# Patient Record
Sex: Female | Born: 1975 | Race: Black or African American | Hispanic: No | Marital: Married | Smoking: Former smoker
Health system: Southern US, Community
[De-identification: ages and names within clinical notes are randomized; demographics above are authoritative.]

## PROBLEM LIST (undated history)

## (undated) DIAGNOSIS — G510 Bell's palsy: Secondary | ICD-10-CM

## (undated) DIAGNOSIS — F419 Anxiety disorder, unspecified: Secondary | ICD-10-CM

## (undated) DIAGNOSIS — I10 Essential (primary) hypertension: Secondary | ICD-10-CM

## (undated) DIAGNOSIS — I639 Cerebral infarction, unspecified: Secondary | ICD-10-CM

## (undated) HISTORY — DX: Cerebral infarction, unspecified: I63.9

## (undated) HISTORY — PX: OTHER SURGICAL HISTORY: SHX169

## (undated) HISTORY — PX: TUBAL LIGATION: SHX77

## (undated) HISTORY — DX: Anxiety disorder, unspecified: F41.9

## (undated) HISTORY — PX: UPPER GI ENDOSCOPY: SHX6162

## (undated) HISTORY — PX: CHOLECYSTECTOMY: SHX55

## (undated) HISTORY — PX: ANKLE SURGERY: SHX546

## (undated) HISTORY — DX: Essential (primary) hypertension: I10

---

## 2013-09-17 ENCOUNTER — Emergency Department (HOSPITAL_COMMUNITY)
Admission: EM | Admit: 2013-09-17 | Discharge: 2013-09-17 | Disposition: A | Discharge status: Home / Self Care | Payer: BC Managed Care – PPO | Attending: Emergency Medicine | Admitting: Emergency Medicine

## 2013-09-17 ENCOUNTER — Encounter (HOSPITAL_COMMUNITY): Payer: Self-pay | Admitting: Emergency Medicine

## 2013-09-17 ENCOUNTER — Emergency Department (HOSPITAL_COMMUNITY): Payer: BC Managed Care – PPO

## 2013-09-17 DIAGNOSIS — R143 Flatulence: Secondary | ICD-10-CM

## 2013-09-17 DIAGNOSIS — Z3202 Encounter for pregnancy test, result negative: Secondary | ICD-10-CM | POA: Insufficient documentation

## 2013-09-17 DIAGNOSIS — Z9851 Tubal ligation status: Secondary | ICD-10-CM | POA: Insufficient documentation

## 2013-09-17 DIAGNOSIS — R142 Eructation: Secondary | ICD-10-CM | POA: Insufficient documentation

## 2013-09-17 DIAGNOSIS — R1032 Left lower quadrant pain: Secondary | ICD-10-CM

## 2013-09-17 DIAGNOSIS — R141 Gas pain: Secondary | ICD-10-CM

## 2013-09-17 DIAGNOSIS — R112 Nausea with vomiting, unspecified: Secondary | ICD-10-CM | POA: Insufficient documentation

## 2013-09-17 LAB — URINALYSIS, ROUTINE W REFLEX MICROSCOPIC
BILIRUBIN URINE: NEGATIVE
Glucose, UA: NEGATIVE mg/dL
Ketones, ur: NEGATIVE mg/dL
Leukocytes, UA: NEGATIVE
Nitrite: NEGATIVE
PH: 8 (ref 5.0–8.0)
PROTEIN: NEGATIVE mg/dL
Specific Gravity, Urine: 1.014 (ref 1.005–1.030)
Urobilinogen, UA: 1 mg/dL (ref 0.0–1.0)

## 2013-09-17 LAB — CBC WITH DIFFERENTIAL/PLATELET
BASOS PCT: 0 % (ref 0–1)
Basophils Absolute: 0 10*3/uL (ref 0.0–0.1)
Eosinophils Absolute: 0.1 10*3/uL (ref 0.0–0.7)
Eosinophils Relative: 1 % (ref 0–5)
HEMATOCRIT: 36.5 % (ref 36.0–46.0)
HEMOGLOBIN: 11.9 g/dL — AB (ref 12.0–15.0)
LYMPHS ABS: 2.6 10*3/uL (ref 0.7–4.0)
LYMPHS PCT: 31 % (ref 12–46)
MCH: 27.2 pg (ref 26.0–34.0)
MCHC: 32.6 g/dL (ref 30.0–36.0)
MCV: 83.3 fL (ref 78.0–100.0)
MONO ABS: 0.7 10*3/uL (ref 0.1–1.0)
MONOS PCT: 8 % (ref 3–12)
NEUTROS ABS: 4.9 10*3/uL (ref 1.7–7.7)
NEUTROS PCT: 59 % (ref 43–77)
Platelets: 284 10*3/uL (ref 150–400)
RBC: 4.38 MIL/uL (ref 3.87–5.11)
RDW: 14.4 % (ref 11.5–15.5)
WBC: 8.3 10*3/uL (ref 4.0–10.5)

## 2013-09-17 LAB — WET PREP, GENITAL
Trich, Wet Prep: NONE SEEN
Yeast Wet Prep HPF POC: NONE SEEN

## 2013-09-17 LAB — POCT PREGNANCY, URINE: Preg Test, Ur: NEGATIVE

## 2013-09-17 LAB — COMPREHENSIVE METABOLIC PANEL
ALBUMIN: 3.4 g/dL — AB (ref 3.5–5.2)
ALK PHOS: 91 U/L (ref 39–117)
ALT: 13 U/L (ref 0–35)
AST: 13 U/L (ref 0–37)
BUN: 7 mg/dL (ref 6–23)
CHLORIDE: 101 meq/L (ref 96–112)
CO2: 26 mEq/L (ref 19–32)
CREATININE: 0.68 mg/dL (ref 0.50–1.10)
Calcium: 9.3 mg/dL (ref 8.4–10.5)
GFR calc Af Amer: >90 mL/min (ref >90)
GFR calc non Af Amer: >90 mL/min (ref >90)
GLUCOSE: 105 mg/dL — AB (ref 70–99)
POTASSIUM: 3.8 meq/L (ref 3.7–5.3)
Sodium: 140 mEq/L (ref 137–147)
Total Bilirubin: <0.2 mg/dL — ABNORMAL LOW (ref 0.3–1.2)
Total Protein: 8.2 g/dL (ref 6.0–8.3)

## 2013-09-17 LAB — URINE MICROSCOPIC-ADD ON

## 2013-09-17 LAB — LIPASE, BLOOD: Lipase: 39 U/L (ref 11–59)

## 2013-09-17 MED ORDER — MINERAL OIL RE ENEM: 1.0000 | ENEMA | Freq: Once | RECTAL | Status: DC

## 2013-09-17 MED ORDER — KETOROLAC TROMETHAMINE 60 MG/2ML IM SOLN
60.0000 mg | Freq: Once | INTRAMUSCULAR | Status: AC
  Administered 2013-09-17: 60 mg via INTRAMUSCULAR
  Filled 2013-09-17: qty 2

## 2013-09-17 NOTE — ED Notes (Signed)
Pt was seen for same abd pain 2 weeks ago. Pt has tried laxatives and stool softeners with no relief.

## 2013-09-17 NOTE — ED Provider Notes (Signed)
CSN: [id number]     Arrival date & time 09/17/13  1620 History   First MD Initiated Contact with Patient 09/17/13 1847     Chief Complaint  Patient presents with  . Abdominal Pain  . Nausea  . Emesis   (Consider location/radiation/quality/duration/timing/severity/associated sxs/prior Treatment) HPI Pt is a 38yo female presenting with LLQ pain that has been intermittent x 3 weeks, sharp in nature, 10/10 at wosrt, 7/10 at this time. Reports being evaluated by [facility] 1-2 weeks ago, had lab work and CT. Advised nothing was found but pt was discharged home with laxatives, stool softeners, gas medication, pain medication, and something for heartburn. Pt states symptoms have not improved. Reports last BM earlier this morning, reports being normal.  LMP ended today, normal per pt. Denies hx of ovarian cysts or fibroids. Denies urinary or vaginal symptoms. Denies fevers. Denies recent sick contacts or recent travel. Denies hx of abdominal surgeries.  History reviewed. No pertinent past medical history. Past Surgical History  Procedure Laterality Date  . Tubal ligation     History reviewed. No pertinent family history. History  Substance Use Topics  . Smoking status: Never Smoker   . Smokeless tobacco: Not on file  . Alcohol Use: Not on file   OB History   Grav Para Term Preterm Abortions TAB SAB Ect Mult Living                 Review of Systems  Constitutional: Negative for fever and chills.  Gastrointestinal: Positive for nausea and abdominal pain ( LLQ). Negative for vomiting, diarrhea, constipation and blood in stool.  Genitourinary: Negative for dysuria, urgency, hematuria, flank pain, decreased urine volume, vaginal bleeding, vaginal discharge, vaginal pain and pelvic pain.  All other systems reviewed and are negative.    Allergies  Erythromycin  Home Medications   Current Outpatient Rx  Name  Route  Sig  Dispense  Refill  . mineral oil enema   Rectal   Place 1  enema rectally once.   3 enema   0   . Multiple Vitamin (MULTIVITAMIN WITH MINERALS) TABS tablet   Oral   Take 1 tablet by mouth daily.          BP 122/78  Pulse 78  Temp(Src) 98.5 F (36.9 C) (Oral)  Resp 18  SpO2 98%  LMP 09/15/2013 Physical Exam  Nursing note and vitals reviewed. Constitutional: She appears well-developed and well-nourished. No distress.  Pt sitting on side of bed, appears mildly uncomfortable.   HENT:  Head: Normocephalic and atraumatic.  Eyes: Conjunctivae are normal. No scleral icterus.  Neck: Normal range of motion.  Cardiovascular: Normal rate, regular rhythm and normal heart sounds.   Pulmonary/Chest: Effort normal and breath sounds normal. No respiratory distress. She has no wheezes. She has no rales. She exhibits no tenderness.  Abdominal: Soft. Bowel sounds are normal. She exhibits no distension and no mass. There is tenderness. There is no rebound and no guarding.  Soft, non-distended.  LLQ pain  Musculoskeletal: Normal range of motion.  Neurological: She is alert.  Skin: Skin is warm and dry. She is not diaphoretic.    ED Course  Procedures (including critical care time) Labs Review Labs Reviewed  WET PREP, GENITAL - Abnormal; Notable for the following:    Clue Cells Wet Prep HPF POC FEW (*)    WBC, Wet Prep HPF POC FEW (*)    All other components within normal limits  CBC WITH DIFFERENTIAL - Abnormal; Notable  for the following:    Hemoglobin 11.9 (*)    All other components within normal limits  COMPREHENSIVE METABOLIC PANEL - Abnormal; Notable for the following:    Glucose, Bld 105 (*)    Albumin 3.4 (*)    Total Bilirubin <0.2 (*)    All other components within normal limits  URINALYSIS, ROUTINE W REFLEX MICROSCOPIC - Abnormal; Notable for the following:    Hgb urine dipstick TRACE (*)    All other components within normal limits  GC/CHLAMYDIA PROBE AMP  LIPASE, BLOOD  URINE MICROSCOPIC-ADD ON  POCT PREGNANCY, URINE    Imaging Review Dg Abd Acute W/chest  09/17/2013   CLINICAL DATA:  Left abdominal pain.  Nausea and vomiting.  EXAM: ACUTE ABDOMEN SERIES (ABDOMEN 2 VIEW & CHEST 1 VIEW)  COMPARISON:  None.  FINDINGS: There is no evidence of dilated bowel loops or free intraperitoneal air. No radiopaque calculi or other significant radiographic abnormality is seen. Clips seen in the pelvis from previous bilateral tubal ligation.  Heart size and mediastinal contours are within normal limits. Both lungs are clear.  IMPRESSION: Negative abdominal radiographs.  No acute cardiopulmonary disease.   Electronically Signed   By: Earle Gell M.D.   On: 09/17/2013 20:17    EKG Interpretation   None       MDM   1. LLQ pain   2. Abdominal gas pain    Pt is a 38yo female with no significant PMH presenting with LLQ pain. Reports being evaluated at [facility] 1-2 weeks ago. Medical records obtained, pt tx for constipation, discharged home with stool softeners and pain medication.  Labs today: CBC, CMP, UA, Lipase, UA: unremarkable. Urine preg: negative.   Pelvic performed: unremarkable.   Wet prep: unremarkable  Will discharge home. Advised to continue taking medications as prescribed from [place] should only be taken when pain becomes severe as narcotics can worsen constipation. Rx: mineral oil enema.      Noland Fordyce, PA-C 09/18/13 [phone]

## 2013-09-17 NOTE — Discharge Instructions (Signed)
Continue taking medication as prescribed by [facility]. Be sure to follow up with a primary care provider and schedule a follow up appointment with a [facility] as needed for continued lower abdominal pain. Return to ER for NEW or worsening symptoms including inability to pass gas, keep down fluids, experience rectal bleeding or develop urinary symptoms.

## 2013-09-17 NOTE — Progress Notes (Signed)
   CARE MANAGEMENT ED NOTE 09/17/2013  Patient:  DebraDebra Potter   Account Number:  [id number]  Date Initiated:  09/17/2013  Documentation initiated by:  Livia Snellen  Subjective/Objective Assessment:   Patient presents to Ed with abdominal pain for two weeks.     Subjective/Objective Assessment Detail:     Action/Plan:   Action/Plan Detail:   Anticipated DC Date:       Status Recommendation to Physician:   Result of Recommendation:    Other ED [place]  Other  PCP issues    Choice offered to / List presented to:            Status of service:  Completed, signed off  ED Comments:   ED Comments Detail:  EDCM spoke to patient at bedside.  Patient confirms that she does not have a pcp.  EDCM instructed patient to call the number on the back of her insurace card or go to insurance compant website to help her find a pcp whois close to her and within network.  Patient verbalized understanding.  No further CM needs at this time.

## 2013-09-17 NOTE — ED Notes (Signed)
Pt c/o abd pain x 2 wks.  States that she was here before for this same issue and was given laxatives, gas medicine, pain meds, and something for heart burn.  States that nothing was found but pt wants to know why she is still hurting.

## 2013-09-18 LAB — GC/CHLAMYDIA PROBE AMP
CT Probe RNA: NEGATIVE
GC Probe RNA: NEGATIVE

## 2013-09-18 NOTE — ED Provider Notes (Signed)
Medical screening examination/treatment/procedure(s) were performed by non-physician practitioner and as supervising physician I was immediately available for consultation/collaboration.  EKG Interpretation   None         Osvaldo Shipper, MD 09/18/13 1616

## 2016-05-13 LAB — HM MAMMOGRAPHY

## 2017-06-30 ENCOUNTER — Inpatient Hospital Stay (HOSPITAL_COMMUNITY): Payer: Managed Care, Other (non HMO)

## 2017-06-30 ENCOUNTER — Emergency Department (HOSPITAL_BASED_OUTPATIENT_CLINIC_OR_DEPARTMENT_OTHER): Payer: Managed Care, Other (non HMO)

## 2017-06-30 ENCOUNTER — Inpatient Hospital Stay (HOSPITAL_BASED_OUTPATIENT_CLINIC_OR_DEPARTMENT_OTHER)
Admission: EM | Admit: 2017-06-30 | Discharge: 2017-07-02 | DRG: 880 | Disposition: A | Discharge status: Home / Self Care | Payer: Managed Care, Other (non HMO) | Attending: Neurology | Admitting: Neurology

## 2017-06-30 ENCOUNTER — Encounter (HOSPITAL_BASED_OUTPATIENT_CLINIC_OR_DEPARTMENT_OTHER): Payer: Self-pay | Admitting: *Deleted

## 2017-06-30 ENCOUNTER — Emergency Department (HOSPITAL_COMMUNITY): Payer: Managed Care, Other (non HMO)

## 2017-06-30 DIAGNOSIS — E876 Hypokalemia: Secondary | ICD-10-CM | POA: Diagnosis present

## 2017-06-30 DIAGNOSIS — R29702 NIHSS score 2: Secondary | ICD-10-CM | POA: Diagnosis present

## 2017-06-30 DIAGNOSIS — E669 Obesity, unspecified: Secondary | ICD-10-CM | POA: Diagnosis present

## 2017-06-30 DIAGNOSIS — G459 Transient cerebral ischemic attack, unspecified: Secondary | ICD-10-CM | POA: Diagnosis present

## 2017-06-30 DIAGNOSIS — R0789 Other chest pain: Secondary | ICD-10-CM

## 2017-06-30 DIAGNOSIS — I059 Rheumatic mitral valve disease, unspecified: Secondary | ICD-10-CM

## 2017-06-30 DIAGNOSIS — Z6837 Body mass index (BMI) 37.0-37.9, adult: Secondary | ICD-10-CM

## 2017-06-30 DIAGNOSIS — R29810 Facial weakness: Secondary | ICD-10-CM | POA: Diagnosis present

## 2017-06-30 DIAGNOSIS — R531 Weakness: Secondary | ICD-10-CM

## 2017-06-30 DIAGNOSIS — I639 Cerebral infarction, unspecified: Secondary | ICD-10-CM | POA: Diagnosis present

## 2017-06-30 DIAGNOSIS — F410 Panic disorder [episodic paroxysmal anxiety] without agoraphobia: Principal | ICD-10-CM | POA: Diagnosis present

## 2017-06-30 DIAGNOSIS — G8194 Hemiplegia, unspecified affecting left nondominant side: Secondary | ICD-10-CM | POA: Diagnosis present

## 2017-06-30 DIAGNOSIS — F419 Anxiety disorder, unspecified: Secondary | ICD-10-CM

## 2017-06-30 DIAGNOSIS — Z881 Allergy status to other antibiotic agents status: Secondary | ICD-10-CM

## 2017-06-30 DIAGNOSIS — I10 Essential (primary) hypertension: Secondary | ICD-10-CM

## 2017-06-30 DIAGNOSIS — E785 Hyperlipidemia, unspecified: Secondary | ICD-10-CM

## 2017-06-30 DIAGNOSIS — Z9282 Status post administration of tPA (rtPA) in a different facility within the last 24 hours prior to admission to current facility: Secondary | ICD-10-CM

## 2017-06-30 HISTORY — DX: Bell's palsy: G51.0

## 2017-06-30 HISTORY — DX: Cerebral infarction, unspecified: I63.9

## 2017-06-30 LAB — COMPREHENSIVE METABOLIC PANEL
ALK PHOS: 63 U/L (ref 38–126)
ALT: 20 U/L (ref 14–54)
ANION GAP: 5 (ref 5–15)
AST: 21 U/L (ref 15–41)
Albumin: 3.6 g/dL (ref 3.5–5.0)
BUN: 9 mg/dL (ref 6–20)
CALCIUM: 8.8 mg/dL — AB (ref 8.9–10.3)
CO2: 24 mmol/L (ref 22–32)
Chloride: 107 mmol/L (ref 101–111)
Creatinine, Ser: 0.83 mg/dL (ref 0.44–1.00)
GFR calc non Af Amer: >60 mL/min (ref >60)
Glucose, Bld: 91 mg/dL (ref 65–99)
Potassium: 3.4 mmol/L — ABNORMAL LOW (ref 3.5–5.1)
SODIUM: 136 mmol/L (ref 135–145)
Total Bilirubin: 0.4 mg/dL (ref 0.3–1.2)
Total Protein: 7.7 g/dL (ref 6.5–8.1)

## 2017-06-30 LAB — DIFFERENTIAL
BASOS PCT: 1 %
Basophils Absolute: 0 10*3/uL (ref 0.0–0.1)
EOS PCT: 3 %
Eosinophils Absolute: 0.2 10*3/uL (ref 0.0–0.7)
LYMPHS PCT: 41 %
Lymphs Abs: 2.9 10*3/uL (ref 0.7–4.0)
MONO ABS: 0.6 10*3/uL (ref 0.1–1.0)
Monocytes Relative: 9 %
Neutro Abs: 3.3 10*3/uL (ref 1.7–7.7)
Neutrophils Relative %: 46 %

## 2017-06-30 LAB — RAPID URINE DRUG SCREEN, HOSP PERFORMED
Amphetamines: NOT DETECTED
BARBITURATES: NOT DETECTED
BENZODIAZEPINES: NOT DETECTED
Cocaine: NOT DETECTED
Opiates: NOT DETECTED
Tetrahydrocannabinol: NOT DETECTED

## 2017-06-30 LAB — URINALYSIS, ROUTINE W REFLEX MICROSCOPIC
BILIRUBIN URINE: NEGATIVE
Glucose, UA: NEGATIVE mg/dL
HGB URINE DIPSTICK: NEGATIVE
KETONES UR: NEGATIVE mg/dL
LEUKOCYTES UA: NEGATIVE
NITRITE: NEGATIVE
Protein, ur: 30 mg/dL — AB
SPECIFIC GRAVITY, URINE: 1.013 (ref 1.005–1.030)
pH: 6 (ref 5.0–8.0)

## 2017-06-30 LAB — CBC
HCT: 40.7 % (ref 36.0–46.0)
Hemoglobin: 13.7 g/dL (ref 12.0–15.0)
MCH: 29 pg (ref 26.0–34.0)
MCHC: 33.7 g/dL (ref 30.0–36.0)
MCV: 86.2 fL (ref 78.0–100.0)
PLATELETS: 288 10*3/uL (ref 150–400)
RBC: 4.72 MIL/uL (ref 3.87–5.11)
RDW: 14.2 % (ref 11.5–15.5)
WBC: 7 10*3/uL (ref 4.0–10.5)

## 2017-06-30 LAB — ETHANOL: Alcohol, Ethyl (B): <10 mg/dL (ref <10)

## 2017-06-30 LAB — PROTIME-INR
INR: 1.04
PROTHROMBIN TIME: 13.5 s (ref 11.4–15.2)

## 2017-06-30 LAB — PREGNANCY, URINE: Preg Test, Ur: NEGATIVE

## 2017-06-30 LAB — TROPONIN I

## 2017-06-30 LAB — APTT: aPTT: 26 seconds (ref 24–36)

## 2017-06-30 MED ORDER — ACETAMINOPHEN 160 MG/5ML PO SOLN: 650.0000 mg | ORAL | Status: DC | PRN

## 2017-06-30 MED ORDER — STROKE: EARLY STAGES OF RECOVERY BOOK
Freq: Once | Status: DC
  Filled 2017-06-30: qty 1

## 2017-06-30 MED ORDER — TRAMADOL HCL 50 MG PO TABS
50.0000 mg | ORAL_TABLET | Freq: Once | ORAL | Status: AC
  Administered 2017-06-30: 50 mg via ORAL
  Filled 2017-06-30: qty 1

## 2017-06-30 MED ORDER — PHENYTOIN SODIUM 50 MG/ML IJ SOLN: 100.0000 mg | Freq: Three times a day (TID) | INTRAMUSCULAR | Status: DC

## 2017-06-30 MED ORDER — IOPAMIDOL (ISOVUE-370) INJECTION 76%
INTRAVENOUS | Status: AC
  Administered 2017-06-30: 50 mL
  Filled 2017-06-30: qty 50

## 2017-06-30 MED ORDER — ACETAMINOPHEN 325 MG PO TABS
650.0000 mg | ORAL_TABLET | ORAL | Status: DC | PRN
  Administered 2017-06-30 – 2017-07-01 (×2): 650 mg via ORAL
  Filled 2017-06-30 (×2): qty 2

## 2017-06-30 MED ORDER — SODIUM CHLORIDE 0.9 % IV SOLN
INTRAVENOUS | Status: DC
  Administered 2017-06-30 – 2017-07-01 (×2): via INTRAVENOUS

## 2017-06-30 MED ORDER — PANTOPRAZOLE SODIUM 40 MG IV SOLR
40.0000 mg | Freq: Every day | INTRAVENOUS | Status: DC
  Administered 2017-06-30: 40 mg via INTRAVENOUS
  Filled 2017-06-30: qty 40

## 2017-06-30 MED ORDER — SODIUM CHLORIDE 0.9 % IV SOLN
50.0000 mL | Freq: Once | INTRAVENOUS | Status: AC
  Administered 2017-06-30: 50 mL via INTRAVENOUS

## 2017-06-30 MED ORDER — ACETAMINOPHEN 650 MG RE SUPP: 650.0000 mg | RECTAL | Status: DC | PRN

## 2017-06-30 MED ORDER — CLEVIDIPINE BUTYRATE 0.5 MG/ML IV EMUL: 0.0000 mg/h | INTRAVENOUS | Status: DC | PRN

## 2017-06-30 MED ORDER — LABETALOL HCL 5 MG/ML IV SOLN
10.0000 mg | Freq: Once | INTRAVENOUS | Status: AC | PRN
  Administered 2017-07-01: 10 mg via INTRAVENOUS
  Filled 2017-06-30: qty 4

## 2017-06-30 MED ORDER — ONDANSETRON HCL 4 MG/2ML IJ SOLN
4.0000 mg | Freq: Four times a day (QID) | INTRAMUSCULAR | Status: DC | PRN
  Administered 2017-06-30: 4 mg via INTRAVENOUS
  Filled 2017-06-30: qty 2

## 2017-06-30 MED ORDER — LABETALOL HCL 5 MG/ML IV SOLN
20.0000 mg | Freq: Once | INTRAVENOUS | Status: DC
  Filled 2017-06-30: qty 4

## 2017-06-30 MED ORDER — POTASSIUM CHLORIDE CRYS ER 20 MEQ PO TBCR
20.0000 meq | EXTENDED_RELEASE_TABLET | Freq: Once | ORAL | Status: AC
  Administered 2017-06-30: 20 meq via ORAL
  Filled 2017-06-30: qty 1

## 2017-06-30 MED ORDER — ALTEPLASE (STROKE) FULL DOSE INFUSION
0.9000 mg/kg | Freq: Once | INTRAVENOUS | Status: AC
  Administered 2017-06-30: 76 mg via INTRAVENOUS
  Filled 2017-06-30: qty 100

## 2017-06-30 MED ORDER — ALTEPLASE 100 MG IV SOLR
INTRAVENOUS | Status: AC
  Filled 2017-06-30: qty 1

## 2017-06-30 NOTE — H&P (Signed)
Neurology H&P  CC: Left sided numbness  History is obtained from:patient  HPI: [name] is a 41 y.o. female with a history of OCP use who presents with left-sided weakness.  She also complains of left-sided chest wall pain.  This is very reproducible to palpation just adjacent to the left breast.   LKW: 3:15 PM tpa given?:  Yes, given at [facility]  ROS: A 14 point ROS was performed and is negative except as noted in the HPI.   PMHx: "Hormones need regulating"   FHx: No history of MI or stroke No history of blood clots  Social History:  reports that she has never smoked. She has never used smokeless tobacco. She reports that she drinks alcohol. She reports that she does not use drugs.   Exam: Current vital signs: BP (!) 175/121   Pulse 75   Temp 98.4 F (36.9 C) (Oral)   Resp 20   Ht 4\' 11"  (1.499 m)   Wt 83.9 kg (184 lb 15.5 oz)   SpO2 100%   BMI 37.36 kg/m  Vital signs in last 24 hours: Temp:  [98.4 F (36.9 C)] 98.4 F (36.9 C) (11/02 1812) Pulse Rate:  [75-93] 75 (11/02 1835) Resp:  [15-20] 20 (11/02 1835) BP: (165-176)/(107-121) 175/121 (11/02 1835) SpO2:  [100 %] 100 % (11/02 1835) Weight:  [83.9 kg (184 lb 15.5 oz)] 83.9 kg (184 lb 15.5 oz) (11/02 1807)  Physical Exam  Constitutional: Appears well-developed and well-nourished.  Psych: Affect appropriate to situation Eyes: No scleral injection HENT: No OP obstrucion Head: Normocephalic.  Cardiovascular: Normal rate and regular rhythm.  Respiratory: Effort normal and breath sounds normal to anterior ascultation GI: Soft.  No distension. There is no tenderness.  Skin: WDI  Neuro: Mental Status: Patient is awake, alert, oriented to person, place, month, year, and situation. Patient is able to give a clear and coherent history. No signs of aphasia or neglect Cranial Nerves: II: Visual Fields are full. Pupils are equal, round, and reactive to light.   III,IV, VI: EOMI without ptosis or diploplia.  V:  Facial sensation is symmetric to temperature VII: Facil movement is mildly weak in the left face including forehead VIII: hearing is intact to voice X: Uvula elevates symmetrically XI: Shoulder shrug is symmetric. XII: tongue is midline without atrophy or fasciculations.  Motor: Tone is normal. Bulk is normal.  She has 4/5 strength of the left arm and leg, with pronator drift and drift in the leg. Sensory: Sensation is diminished throughout the left side, she did appear to split the midline to pinprick Cerebellar: FNF and HKS are intact bilaterally   I have reviewed labs in epic and the results pertinent to this consultation are: CMP-mild hypokalemia  I have reviewed the images obtained: CT head-no acute findings  Impression: This 41 year old female with acute onset left-sided weakness and numbness most consistent with small ischemic infarct.  She has been treated with IV TPA, and is being admitted for further evaluation.  Recommendations: 1. HgbA1c, fasting lipid panel 2. MRI, MRA  of the brain without contrast 3. Frequent neuro checks 4. Echocardiogram 5. Carotid dopplers 6. Prophylactic therapy-Antiplatelet med: Aspirin - dose 325mg  PO or 300mg  PR 7. Risk factor modification 8. Telemetry monitoring 9. PT consult, OT consult, Speech consult 10.  Oral potassium for hypokalemia 11.  Discontinue OCP 12. please page stroke NP  Or  PA  Or MD  from 8am -4 pm as this patient will be followed by the stroke team  at this point.   You can look them up on [email or web address]    Roland Rack, MD [facility] [phone]  If 7pm- 7am, please page neurology on call as listed in [place]. 06/30/2017  7:19 PM

## 2017-06-30 NOTE — ED Notes (Signed)
EDP at bedside  

## 2017-06-30 NOTE — ED Notes (Signed)
Code Stroke called per EDP.

## 2017-06-30 NOTE — ED Triage Notes (Signed)
Yesterday she started having a stabbing pain in both sides of her chest. Pain continued through the night and today. 3 hours ago she starting having sharp pain in her left arm with numbness in her left arm. She is able to move her arm and hand but it feels numb.

## 2017-06-30 NOTE — ED Notes (Signed)
Per pt she started having L arm numbness that started at 15:15 today. Pt has a hx of Bell's Palsy with normal facial drooping on the L side. Per visitor at bedside he notes that pt's smile is drooping more than usual on the L side.

## 2017-06-30 NOTE — ED Provider Notes (Signed)
[facility] 4NORTH NEURO/TRAUMA/SURGICAL ICU Provider Note  CSN: [id number] Arrival date & time: 06/30/17 1801  Chief Complaint(s) Chest Pain and Numbness  HPI [name] is a 41 y.o. female with a history of right-sided Bell's palsy presents to the emergency department with 2 days of aching left-sided chest wall pain exacerbated with movement and palpation.  No associated shortness of breath.  No recent fevers or infections.  Additionally patient reports sudden onset of left arm numbness and weakness with associated left facial droop that began approximately 3 hours prior to arrival.  Patient denies any associated headache, visual disturbance, trouble speaking.  She is endorsing mild left lower extremity weakness.  No alleviating or aggravating factors.  She denies any other physical complaints.  HPI  Past Medical History History reviewed. No pertinent past medical history. Patient Active Problem List   Diagnosis Date Noted  . 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 Authorizing Provider  norethindrone (AYGESTIN) 5 MG tablet Take by mouth daily.   Yes [provider]  mineral oil enema Place 1 enema rectally once. Patient not taking: Reported on 06/30/2017 09/17/13   Tyrell Antonio                                                                                                                                    Past Surgical History Past Surgical History:  Procedure Laterality Date  . CHOLECYSTECTOMY    . TUBAL LIGATION     Family History No family history on file.  Social History Social History  Substance Use Topics  . Smoking status: Never Smoker  . Smokeless tobacco: Never Used  . Alcohol use Yes   Allergies Erythromycin  Review of Systems Review of Systems All other systems are reviewed and are negative for acute change except as noted in the HPI  Physical Exam Vital  Signs  I have reviewed the triage vital signs BP (!) 165/90   Pulse 79   Temp 98.6 F (37 C) (Oral)   Resp (!) 22   Ht 4\' 11"  (1.499 m)   Wt 83.4 kg (183 lb 13.8 oz)   SpO2 99%   BMI 37.14 kg/m   Physical Exam  Constitutional: She is oriented to person, place, and time. She appears well-developed and well-nourished. No distress.  HENT:  Head: Normocephalic and atraumatic.  Nose: Nose normal.  Eyes: Pupils are equal, round, and reactive to light. Conjunctivae and EOM are normal. Right eye exhibits no discharge. Left eye exhibits no discharge. No scleral icterus.  Neck: Normal range of motion. Neck supple.  Cardiovascular: Normal rate and regular rhythm.  Exam reveals no gallop and no friction rub.   No murmur heard. Pulmonary/Chest: Effort normal and breath sounds normal. No stridor. No respiratory distress. She has no rales. She exhibits tenderness.    Abdominal: Soft. She exhibits  no distension. There is no tenderness.  Musculoskeletal: She exhibits no edema or tenderness.  Neurological: She is alert and oriented to person, place, and time.  Mental Status:  Alert and oriented to person, place, and time.  Attention and concentration normal.  Speech clear.  Recent memory is intact  Cranial Nerves:  II Visual Fields: Intact to confrontation. Visual fields intact. III, IV, VI: Pupils equal and reactive to light and near. Full eye movement without nystagmus  V Facial Sensation: Normal. No weakness of masticatory muscles  VII: mild right facial droop (baseline); left facial droop, forehead sparring. VIII Auditory Acuity: Grossly normal  IX/X: The uvula is midline; the palate elevates symmetrically  XI: Normal sternocleidomastoid and trapezius strength  XII: The tongue is midline. No atrophy or fasciculations.   Motor System: Muscle Strength: 4-/5 to LUE and LLE with drift in both extremities. Right extremities with 5/5 strength  Muscle Tone: Tone and muscle bulk are normal in  the upper and lower extremities.   Reflexes: DTRs: 1+ and symmetrical in all four extremities. No Clonus Coordination: Intact finger-to-nose, heel-to-shin. No tremor.  Sensation: Decreased sensation to left upper and lower extremity Gait: Deferred  Skin: Skin is warm and dry. No rash noted. She is not diaphoretic. No erythema.  Psychiatric: She has a normal mood and affect.  Vitals reviewed.   ED Results and Treatments Labs (all labs ordered are listed, but only abnormal results are displayed) Labs Reviewed  COMPREHENSIVE METABOLIC PANEL - Abnormal; Notable for the following:       Result Value   Potassium 3.4 (*)    Calcium 8.8 (*)    All other components within normal limits  URINALYSIS, ROUTINE W REFLEX MICROSCOPIC - Abnormal; Notable for the following:    Color, Urine STRAW (*)    Protein, ur 30 (*)    Bacteria, UA RARE (*)    Squamous Epithelial / LPF 0-5 (*)    All other components within normal limits  MRSA PCR SCREENING  ETHANOL  CBC  DIFFERENTIAL  TROPONIN I  PROTIME-INR  APTT  PREGNANCY, URINE  RAPID URINE DRUG SCREEN, HOSP PERFORMED  RAPID URINE DRUG SCREEN, HOSP PERFORMED  URINALYSIS, ROUTINE W REFLEX MICROSCOPIC  PREGNANCY, URINE  HEMOGLOBIN A1C  LIPID PANEL  CBC  BASIC METABOLIC PANEL  HIV ANTIBODY (ROUTINE TESTING)                                                                                                                         EKG  EKG Interpretation  Date/Time:  Friday June 30 2017 18:12:03 EDT Ventricular Rate:  68 PR Interval:  122 QRS Duration: 92 QT Interval:  376 QTC Calculation: 399 R Axis:   -3 Text Interpretation:  Normal sinus rhythm with sinus arrhythmia Normal ECG NO STEMI No old tracing to compare Confirmed by Addison Lank [phone]) on 06/30/2017 6:15:24 PM      Radiology Ct Angio Head W Or Wo Contrast  Result Date: 06/30/2017 CLINICAL  DATA:  41 y/o  F; stroke follow-up. EXAM: CT ANGIOGRAPHY HEAD AND NECK TECHNIQUE:  Multidetector CT imaging of the head and neck was performed using the standard protocol during bolus administration of intravenous contrast. Multiplanar CT image reconstructions and MIPs were obtained to evaluate the vascular anatomy. Carotid stenosis measurements (when applicable) are obtained utilizing NASCET criteria, using the distal internal carotid diameter as the denominator. CONTRAST:  50 cc Isovue 370 COMPARISON:  06/30/2017 CT head. FINDINGS: CTA NECK FINDINGS Aortic arch: Standard branching. Imaged portion shows no evidence of aneurysm or dissection. No significant stenosis of the major arch vessel origins. Right carotid system: No evidence of dissection, stenosis (50% or greater) or occlusion. Left carotid system: No evidence of dissection, stenosis (50% or greater) or occlusion. Vertebral arteries: Codominant. No evidence of dissection, stenosis (50% or greater) or occlusion. Skeleton: Negative. Other neck: Negative. Upper chest: Negative. Review of the MIP images confirms the above findings CTA HEAD FINDINGS Anterior circulation: No significant stenosis, proximal occlusion, aneurysm, or vascular malformation. Posterior circulation: No significant stenosis, proximal occlusion, aneurysm, or vascular malformation. Venous sinuses: As permitted by contrast timing, patent. Anatomic variants: Patent anterior and bilateral posterior communicating arteries. Delayed phase: No abnormal intracranial enhancement. Review of the MIP images confirms the above findings IMPRESSION: 1. Patent carotid and vertebral arteries. No dissection, aneurysm, or hemodynamically significant stenosis utilizing NASCET criteria. 2. Patent circle of Willis. No large vessel occlusion, aneurysm, or significant stenosis. Electronically Signed   By: Kristine Garbe M.D.   On: 06/30/2017 21:41   Ct Angio Neck W Or Wo Contrast  Result Date: 06/30/2017 CLINICAL DATA:  41 y/o  F; stroke follow-up. EXAM: CT ANGIOGRAPHY HEAD AND NECK  TECHNIQUE: Multidetector CT imaging of the head and neck was performed using the standard protocol during bolus administration of intravenous contrast. Multiplanar CT image reconstructions and MIPs were obtained to evaluate the vascular anatomy. Carotid stenosis measurements (when applicable) are obtained utilizing NASCET criteria, using the distal internal carotid diameter as the denominator. CONTRAST:  50 cc Isovue 370 COMPARISON:  06/30/2017 CT head. FINDINGS: CTA NECK FINDINGS Aortic arch: Standard branching. Imaged portion shows no evidence of aneurysm or dissection. No significant stenosis of the major arch vessel origins. Right carotid system: No evidence of dissection, stenosis (50% or greater) or occlusion. Left carotid system: No evidence of dissection, stenosis (50% or greater) or occlusion. Vertebral arteries: Codominant. No evidence of dissection, stenosis (50% or greater) or occlusion. Skeleton: Negative. Other neck: Negative. Upper chest: Negative. Review of the MIP images confirms the above findings CTA HEAD FINDINGS Anterior circulation: No significant stenosis, proximal occlusion, aneurysm, or vascular malformation. Posterior circulation: No significant stenosis, proximal occlusion, aneurysm, or vascular malformation. Venous sinuses: As permitted by contrast timing, patent. Anatomic variants: Patent anterior and bilateral posterior communicating arteries. Delayed phase: No abnormal intracranial enhancement. Review of the MIP images confirms the above findings IMPRESSION: 1. Patent carotid and vertebral arteries. No dissection, aneurysm, or hemodynamically significant stenosis utilizing NASCET criteria. 2. Patent circle of Willis. No large vessel occlusion, aneurysm, or significant stenosis. Electronically Signed   By: Kristine Garbe M.D.   On: 06/30/2017 21:41   Dg Chest Port 1 View  Result Date: 06/30/2017 CLINICAL DATA:  New onset left arm numbness. EXAM: PORTABLE CHEST 1 VIEW  COMPARISON:  None. FINDINGS: The heart size and mediastinal contours are within normal limits. Both lungs are clear. The visualized skeletal structures are unremarkable. IMPRESSION: Negative one-view chest x-ray Electronically Signed   By: San Morelle  M.D.   On: 06/30/2017 20:07   Ct Head Code Stroke Wo Contrast  Result Date: 06/30/2017 CLINICAL DATA:  Code stroke. Initial evaluation for acute left-sided numbness and weakness. EXAM: CT HEAD WITHOUT CONTRAST TECHNIQUE: Contiguous axial images were obtained from the base of the skull through the vertex without intravenous contrast. COMPARISON:  None. FINDINGS: Brain: Cerebral volume within normal limits for patient age. No evidence for acute intracranial hemorrhage. No findings to suggest acute large vessel territory infarct. No mass lesion, midline shift, or mass effect. Ventricles are normal in size without evidence for hydrocephalus. No extra-axial fluid collection identified. Vascular: No hyperdense vessel identified. Skull: Scalp soft tissues demonstrate no acute abnormality.Calvarium intact. Sinuses/Orbits: Globes and orbital soft tissues are within normal limits. Mild scattered mucosal thickening within the ethmoidal air cells. Paranasal sinuses are otherwise clear. No mastoid effusion. ASPECTS [facility] Stroke Program Early CT Score) - Ganglionic level infarction (caudate, lentiform nuclei, internal capsule, insula, M1-M3 cortex): 7 - Supraganglionic infarction (M4-M6 cortex): 3 Total score (0-10 with 10 being normal): 10 IMPRESSION: 1. Negative head CT.  No acute intracranial abnormality identified. 2. ASPECTS is 10. Critical Value/emergent results were called by telephone at the time of interpretation on 06/30/2017 at 7:00 pm to Dr. Addison Lank , who verbally acknowledged these results. Electronically Signed   By: Jeannine Boga M.D.   On: 06/30/2017 19:02   Pertinent labs & imaging results that were available during my care of the  patient were reviewed by me and considered in my medical decision making (see chart for details).  Medications Ordered in ED Medications  alteplase (ACTIVASE) 1 mg/mL injection (not administered)  labetalol (NORMODYNE,TRANDATE) injection 20 mg (0 mg Intravenous Hold 06/30/17 2025)   stroke: mapping our early stages of recovery book (not administered)  0.9 %  sodium chloride infusion ( Intravenous New Bag/Given 06/30/17 2321)  acetaminophen (TYLENOL) tablet 650 mg (650 mg Oral Given 06/30/17 2217)    Or  acetaminophen (TYLENOL) solution 650 mg ( Per Tube See Alternative 06/30/17 2217)    Or  acetaminophen (TYLENOL) suppository 650 mg ( Rectal See Alternative 06/30/17 2217)  pantoprazole (PROTONIX) injection 40 mg (40 mg Intravenous Given 06/30/17 2217)  labetalol (NORMODYNE,TRANDATE) injection 10 mg (not administered)    And  clevidipine (CLEVIPREX) infusion 0.5 mg/mL (not administered)  ondansetron (ZOFRAN) injection 4 mg (4 mg Intravenous Given 06/30/17 2318)  alteplase (ACTIVASE) 1 mg/mL infusion 76 mg (0 mg/kg  83.9 kg Intravenous Stopped 06/30/17 2001)    Followed by  0.9 %  sodium chloride infusion (0 mLs Intravenous Stopped 06/30/17 2043)  iopamidol (ISOVUE-370) 76 % injection (50 mLs  Contrast Given 06/30/17 2059)  potassium chloride SA (K-DUR,KLOR-CON) CR tablet 20 mEq (20 mEq Oral Given 06/30/17 2217)  traMADol (ULTRAM) tablet 50 mg (50 mg Oral Given 06/30/17 2319)  Procedures Procedures CRITICAL CARE Performed by: Grayce Sessions Cardama Total critical care time: 35 minutes Critical care time was exclusive of separately billable procedures and treating other patients. Critical care was necessary to treat or prevent imminent or life-threatening deterioration. Critical care was time spent personally by me on the following activities: development of treatment  plan with patient and/or surrogate as well as nursing, discussions with consultants, evaluation of patient's response to treatment, examination of patient, obtaining history from patient or surrogate, ordering and performing treatments and interventions, ordering and review of laboratory studies, ordering and review of radiographic studies, pulse oximetry and re-evaluation of patient's condition.   (including critical care time)  Medical Decision Making / ED Course I have reviewed the nursing notes for this encounter and the patient's prior records (if available in EHR or on provided paperwork).     1. Extremity weakness Patient with new onset left-sided numbness and weakness.  Exam consistent with left upper and lower extremity weakness with left facial droop concerning for acute CVA.  Patient is within the 4-hour window.  Risks and benefits of TPA discussed with patient.  Patient agreed to receive TPA if needed.  Code stroke was initiated.  Discussed case with Dr. Malen Gauze, who recommended TPA if we feel necessary.  TPA initiated.  Patient will be sent to [facility] for further evaluation and management.   2. Chest pain Highly atypical and inconsistent with ACS.  Most consistent with chest wall pain.  EKG without acute ischemic changes or evidence of pericarditis.  Initial troponin negative.  Since the pain has been constant since yesterday feel this is sufficient to rule out ACS.  Low suspicion for pulmonary embolism. Chest x-ray without evidence suggestive of pneumonia, pneumothorax, pneumomediastinum.  No abnormal contour of the mediastinum to suggest dissection. No evidence of acute injuries.    Patient transferred to [facility].  Final Clinical Impression(s) / ED Diagnoses Final diagnoses:  Chest wall pain  Left-sided weakness      This chart was dictated using voice recognition software.  Despite best efforts to proofread,  errors can occur which can change the documentation  meaning.   Fatima Blank, MD 07/01/17 [phone]

## 2017-06-30 NOTE — ED Notes (Signed)
EDP notified of possible code stroke.

## 2017-06-30 NOTE — ED Notes (Signed)
Pt transported via [facility] with this RN to accompany pt to [facility].

## 2017-06-30 NOTE — ED Notes (Signed)
Pt taken to CT.

## 2017-07-01 ENCOUNTER — Encounter (HOSPITAL_COMMUNITY): Payer: Self-pay | Admitting: *Deleted

## 2017-07-01 ENCOUNTER — Inpatient Hospital Stay (HOSPITAL_COMMUNITY): Payer: Managed Care, Other (non HMO)

## 2017-07-01 DIAGNOSIS — L121 Cicatricial pemphigoid: Secondary | ICD-10-CM

## 2017-07-01 DIAGNOSIS — M940 Chondrocostal junction syndrome [Tietze]: Secondary | ICD-10-CM

## 2017-07-01 DIAGNOSIS — F419 Anxiety disorder, unspecified: Secondary | ICD-10-CM

## 2017-07-01 LAB — HIV ANTIBODY (ROUTINE TESTING W REFLEX): HIV SCREEN 4TH GENERATION: NONREACTIVE

## 2017-07-01 LAB — BASIC METABOLIC PANEL
Anion gap: 6 (ref 5–15)
BUN: 7 mg/dL (ref 6–20)
CALCIUM: 8.3 mg/dL — AB (ref 8.9–10.3)
CO2: 23 mmol/L (ref 22–32)
CREATININE: 0.82 mg/dL (ref 0.44–1.00)
Chloride: 107 mmol/L (ref 101–111)
Glucose, Bld: 84 mg/dL (ref 65–99)
Potassium: 3.7 mmol/L (ref 3.5–5.1)
SODIUM: 136 mmol/L (ref 135–145)

## 2017-07-01 LAB — CBC
HCT: 37.6 % (ref 36.0–46.0)
Hemoglobin: 12.5 g/dL (ref 12.0–15.0)
MCH: 28.7 pg (ref 26.0–34.0)
MCHC: 33.2 g/dL (ref 30.0–36.0)
MCV: 86.4 fL (ref 78.0–100.0)
PLATELETS: 258 10*3/uL (ref 150–400)
RBC: 4.35 MIL/uL (ref 3.87–5.11)
RDW: 14.1 % (ref 11.5–15.5)
WBC: 6.8 10*3/uL (ref 4.0–10.5)

## 2017-07-01 LAB — HEMOGLOBIN A1C
Hgb A1c MFr Bld: 5.4 % (ref 4.8–5.6)
Mean Plasma Glucose: 108.28 mg/dL

## 2017-07-01 LAB — LIPID PANEL
CHOL/HDL RATIO: 4.8 ratio
Cholesterol: 134 mg/dL (ref 0–200)
HDL: 28 mg/dL — ABNORMAL LOW (ref >40)
LDL CALC: 94 mg/dL (ref 0–99)
Triglycerides: 58 mg/dL (ref <150)
VLDL: 12 mg/dL (ref 0–40)

## 2017-07-01 LAB — MRSA PCR SCREENING: MRSA BY PCR: NEGATIVE

## 2017-07-01 MED ORDER — BUTALBITAL-APAP-CAFFEINE 50-325-40 MG PO TABS
1.0000 | ORAL_TABLET | Freq: Two times a day (BID) | ORAL | Status: DC | PRN
  Administered 2017-07-01 – 2017-07-02 (×2): 1 via ORAL
  Filled 2017-07-01 (×2): qty 1

## 2017-07-01 MED ORDER — LISINOPRIL 20 MG PO TABS
20.0000 mg | ORAL_TABLET | Freq: Every day | ORAL | Status: DC
  Administered 2017-07-02: 20 mg via ORAL
  Filled 2017-07-01: qty 1

## 2017-07-01 MED ORDER — LABETALOL HCL 5 MG/ML IV SOLN
10.0000 mg | INTRAVENOUS | Status: DC | PRN
  Administered 2017-07-02: 10 mg via INTRAVENOUS
  Filled 2017-07-01: qty 4

## 2017-07-01 MED ORDER — ATORVASTATIN CALCIUM 40 MG PO TABS: 40.0000 mg | ORAL_TABLET | Freq: Every day | ORAL | Status: DC

## 2017-07-01 MED ORDER — ASPIRIN EC 81 MG PO TBEC
81.0000 mg | DELAYED_RELEASE_TABLET | Freq: Every day | ORAL | Status: DC
  Administered 2017-07-02: 81 mg via ORAL
  Filled 2017-07-01: qty 1

## 2017-07-01 MED ORDER — PANTOPRAZOLE SODIUM 40 MG PO TBEC
40.0000 mg | DELAYED_RELEASE_TABLET | Freq: Every day | ORAL | Status: DC
  Administered 2017-07-01 – 2017-07-02 (×2): 40 mg via ORAL
  Filled 2017-07-01 (×2): qty 1

## 2017-07-01 NOTE — Progress Notes (Signed)
STROKE TEAM PROGRESS NOTE   HISTORY OF PRESENT ILLNESS (per record) [name] is a 41 y.o. female with a history of OCP use who presents with left-sided weakness. She also complains of left-sided chest wall pain.  This is very reproducible to palpation just adjacent to the left breast.  LKW: 3:15 PM tpa given?:  Yes, given at [place] (INTERVAL HISTORY) No family is at the bedside.  She stated that she started to have left chest underneath the breast pain Thursday 1pm at work. She did not go to work Friday due to continued left chest pain. On Friday 3:15pm, she started to have left arm pain and at the same time felt some numbness of left arm along with heaviness feeling, which went to her leg also. Her husband got her to the [facility]. She can still walk but some slow on the left. She received tPA there and transferred here. Now she still has mild numbness and heaviness feeling on the left. Still has left chest pain underneath the breast. On palpation of the left upper quadrant on the left breast, she felt worsening tenderness on palpation, better without palpation.   She has right bell's palsy at 41 years of age. Still has right facial droop and right eye difficulty closure.    OBJECTIVE Temp:  [97.6 F (36.4 C)-98.9 F (37.2 C)] 98.4 F (36.9 C) (11/03 0745) Pulse Rate:  [59-93] 86 (11/03 1100) Cardiac Rhythm: Normal sinus rhythm (11/03 0800) Resp:  [11-24] 24 (11/03 1100) BP: (129-176)/(87-121) 162/102 (11/03 1100) SpO2:  [96 %-100 %] 99 % (11/03 1100) Weight:  [183 lb 13.8 oz (83.4 kg)-184 lb 15.5 oz (83.9 kg)] 183 lb 13.8 oz (83.4 kg) (11/02 2145)  CBC:   Recent Labs Lab 06/30/17 1825 07/01/17 0444  WBC 7.0 6.8  NEUTROABS 3.3  --   HGB 13.7 12.5  HCT 40.7 37.6  MCV 86.2 86.4  PLT 288 818    Basic Metabolic Panel:   Recent Labs Lab 06/30/17 1825 07/01/17 0444  NA 136 136  K 3.4* 3.7  CL 107 107  CO2 24 23  GLUCOSE 91 84  BUN 9 7  CREATININE 0.83  0.82  CALCIUM 8.8* 8.3*    Lipid Panel:     Component Value Date/Time   CHOL 134 07/01/2017 0444   TRIG 58 07/01/2017 0444   HDL 28 (L) 07/01/2017 0444   CHOLHDL 4.8 07/01/2017 0444   VLDL 12 07/01/2017 0444   LDLCALC 94 07/01/2017 0444   HgbA1c:  Lab Results  Component Value Date   HGBA1C 5.4 07/01/2017   Urine Drug Screen:     Component Value Date/Time   LABOPIA NONE DETECTED 06/30/2017 2130   COCAINSCRNUR NONE DETECTED 06/30/2017 2130   LABBENZ NONE DETECTED 06/30/2017 2130   AMPHETMU NONE DETECTED 06/30/2017 2130   THCU NONE DETECTED 06/30/2017 2130   LABBARB NONE DETECTED 06/30/2017 2130    Alcohol Level     Component Value Date/Time   ETH <10 06/30/2017 1825    IMAGING I have personally reviewed the radiological images below and agree with the radiology interpretations.  Ct Angio Head W Or Wo Contrast Ct Angio Neck W Or Wo Contrast 06/30/2017 IMPRESSION:  1. Patent carotid and vertebral arteries. No dissection, aneurysm, or hemodynamically significant stenosis utilizing NASCET criteria.  2. Patent circle of Willis. No large vessel occlusion, aneurysm, or significant stenosis.   Ct Head Code Stroke Wo Contrast 06/30/2017 IMPRESSION:  1. Negative head CT.  No  acute intracranial abnormality identified.  2. ASPECTS is 10.   MR Brain Wo Contrast - pending  Transthoracic Echocardiogram - pending  Bilateral Lower Extremity Dopplers - pending    PHYSICAL EXAM  Temp:  [97.6 F (36.4 C)-98.9 F (37.2 C)] 98.4 F (36.9 C) (11/03 0745) Pulse Rate:  [59-93] 86 (11/03 1100) Resp:  [11-24] 24 (11/03 1100) BP: (129-176)/(87-121) 162/102 (11/03 1100) SpO2:  [96 %-100 %] 99 % (11/03 1100) Weight:  [183 lb 13.8 oz (83.4 kg)-184 lb 15.5 oz (83.9 kg)] 183 lb 13.8 oz (83.4 kg) (11/02 2145)   Vitals:   07/01/17 0800 07/01/17 0900 07/01/17 1000 07/01/17 1100  BP: (!) 144/102 (!) 155/118 (!) 145/112 (!) 162/102  Pulse: 77 81 74 86  Resp: 12 20 13  (!) 24  Temp:       TempSrc:      SpO2: 98% 99% 99% 99%  Weight:      Height:         General - Well nourished, well developed, in no apparent distress.  Ophthalmologic - Fundi not visualized due to noncooperation.  Cardiovascular - Regular rate and rhythm.  Mental Status -  Level of arousal and orientation to time, place, and person were intact. Language including expression, naming, repetition, comprehension was assessed and found intact. Fund of Knowledge was assessed and was intact.  Cranial Nerves II - XII - II - Visual field intact OU. 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 V - Facial sensation subjectivey decreased on the left, but also decreased on the left vibration on the forehead tuning fork testing. VII - right facial mild droop, right eye closure incomplete. VIII - Hearing & vestibular intact bilaterally. X - Palate elevates symmetrically. XI - Chin turning & shoulder shrug intact bilaterally. XII - Tongue protrusion intact.  Motor Strength - The patient's strength was normal in all extremities except mild giveaway weakness on the left LE and pronator drift was absent.  Bulk was normal and fasciculations were absent.   Motor Tone - Muscle tone was assessed at the neck and appendages and was normal.  Reflexes - The patient's reflexes were 1+ in all extremities and she had no pathological reflexes.  Sensory - Light touch, temperature/pinprick were assessed and were subjectively decreased on the left, about 85-95% of the right.    Coordination - The patient had normal movements in the hands with no ataxia or dysmetria.  Tremor was absent.  Gait and Station - deferred.   ASSESSMENT/PLAN Ms. [name] is a 41 y.o. female with history of Bell's palsy with residual right facial droop and oral contraceptives prior to admission presenting with left sided weakness and left sided chest wall pain. She received IV t-PA at [facility] Friday,  06/30/2017 at 1900 hrs.  Likely anxiety   Resultant subjective mild left-sided numbness, left giveaway weakness, headache  CT head - Negative head CT.  No acute intracranial abnormality identified.   MRI head - pending  Bilateral LE Dopplers - pending  CTA H&N - no significant stenosis noted.  2D Echo - pending  LDL - 94  HgbA1c - 5.4  VTE prophylaxis - SCDs Diet regular Room service appropriate? Yes; Fluid consistency: Thin  No antithrombotic prior to admission, now on No antithrombotic secondary to wheezing 24 hours of TPA therapy.  Patient counseled to be compliant with her antithrombotic medications  Ongoing aggressive stroke risk factor management  Therapy recommendations:  Home health PT  Disposition: Pending  Left chest wall pain  Troponin negative  Focal left upper quadrant of breast tenderness on palpation  Could be costochondritis  Tylenol PRN  Other Stroke Risk Factors  ETOH use, advised to drink no more than 1 drink per day.  Obesity, Body mass index is 37.14 kg/m., recommend weight loss, diet and exercise as appropriate   OCP PTA - progesterone only  Other Active Problems  Hypokalemia - supplement  Oral contraceptive use - progesterone only  Hospital day # 1  This patient is critically ill due to strokelike symptoms status post TPA and at significant risk of neurological worsening, death form hemorrhage. This patient's care requires constant monitoring of vital signs, hemodynamics, respiratory and cardiac monitoring, review of multiple databases, neurological assessment, discussion with family, other specialists and medical decision making of high complexity. I spent 35 minutes of neurocritical care time in the care of this patient.  Rosalin Hawking, MD PhD Stroke Neurology 07/01/2017 5:30 PM   To contact Stroke Continuity provider, please refer to [email or web address] After hours, contact General Neurology

## 2017-07-01 NOTE — Evaluation (Signed)
Physical Therapy Evaluation Patient Details Name: [name] MRN: [id number] DOB: 1975/12/21 Today's Date: 07/01/2017   History of Present Illness  41 year old female with acute onset left-sided weakness and numbness most consistent with small ischemic infarct.  She has been treated with IV TPA  Clinical Impression  Orders received for PT evaluation. Patient demonstrates deficits in functional mobility as indicated below. Will benefit from continued skilled PT to address deficits and maximize function. Will see as indicated and progress as tolerated.  OF NOTE: patient currently requiring min assist for safety and stability but anticipate patient will progress quickly. Recommend HHPT and supervision upon acute discharge.     Follow Up Recommendations Home health PT;Supervision/Assistance - 24 hour    Equipment Recommendations   (TBD)    Recommendations for Other Services OT consult     Precautions / Restrictions Precautions Precautions: Fall      Mobility  Bed Mobility Overal bed mobility: Needs Assistance Bed Mobility: Supine to Sit     Supine to sit: Min guard     General bed mobility comments: Increased time and effort to perform, min guard for safety, no physical assist required. Patient with use of bed rail.  Transfers Overall transfer level: Needs assistance Equipment used: 1 person hand held assist Transfers: Sit to/from Stand Sit to Stand: Min assist         General transfer comment: min assist for stability  Ambulation/Gait Ambulation/Gait assistance: Min assist Ambulation Distance (Feet): 50 Feet Assistive device: 1 person hand held assist Gait Pattern/deviations: Step-through pattern;Decreased stride length;Decreased stance time - left;Antalgic;Drifts right/left Gait velocity: decreased Gait velocity interpretation: Below normal speed for age/gender General Gait Details: patient with noted instability, required min assist HHA for stability due to pain  and sensory deficits in LLE. Patient reports pain most uncomfotable around knee joint with weight bearing.   Stairs            Wheelchair Mobility    Modified Rankin (Stroke Patients Only) Modified Rankin (Stroke Patients Only) Pre-Morbid Rankin Score: No symptoms Modified Rankin: Moderately severe disability     Balance Overall balance assessment: Needs assistance Sitting-balance support: Feet supported Sitting balance-Leahy Scale: Fair     Standing balance support: No upper extremity supported Standing balance-Leahy Scale: Fair Standing balance comment: patient with some increased sway noted in static standing, able to maintain upright during functional task without physical assist                             Pertinent Vitals/Pain Pain Assessment: Faces Faces Pain Scale: Hurts even more Pain Location: left knee pain, and modest left UE pain Pain Descriptors / Indicators: Grimacing;Guarding;Sharp Pain Intervention(s): Monitored during session;Repositioned    Home Living Family/patient expects to be discharged to:: Private residence Living Arrangements: Spouse/significant other Available Help at Discharge: Family Type of Home: House Home Access: Stairs to enter Entrance Stairs-Rails: None Entrance Stairs-Number of Steps: 3 Home Layout: Two level;Able to live on main level with bedroom/bathroom Home Equipment: None      Prior Function Level of Independence: Independent               Hand Dominance   Dominant Hand: Right    Extremity/Trunk Assessment   Upper Extremity Assessment Upper Extremity Assessment: Difficult to assess due to impaired cognition    Lower Extremity Assessment Lower Extremity Assessment: LLE deficits/detail LLE Deficits / Details: noted assymetrical weakness, 4-/5 gross motions, increased edema note LLE, pain reported  LLE Sensation: decreased light touch LLE Coordination: decreased fine motor       Communication    Communication: No difficulties  Cognition Arousal/Alertness: Awake/alert Behavior During Therapy: Flat affect Overall Cognitive Status: No family/caregiver present to determine baseline cognitive functioning                                        General Comments      Exercises     Assessment/Plan    PT Assessment Patient needs continued PT services  PT Problem List Decreased strength;Decreased activity tolerance;Decreased balance;Decreased mobility;Decreased coordination;Impaired sensation;Pain       PT Treatment Interventions DME instruction;Gait training;Stair training;Functional mobility training;Therapeutic activities;Therapeutic exercise;Balance training;Neuromuscular re-education;Patient/family education    PT Goals (Current goals can be found in the Care Plan section)  Acute Rehab PT Goals Patient Stated Goal: to get better PT Goal Formulation: With patient Time For Goal Achievement: 07/15/17 Potential to Achieve Goals: Good    Frequency Min 4X/week   Barriers to discharge        Co-evaluation               AM-PAC PT "6 Clicks" Daily Activity  Outcome Measure Difficulty turning over in bed (including adjusting bedclothes, sheets and blankets)?: A Little Difficulty moving from lying on back to sitting on the side of the bed? : A Little Difficulty sitting down on and standing up from a chair with arms (e.g., wheelchair, bedside commode, etc,.)?: A Little Help needed moving to and from a bed to chair (including a wheelchair)?: A Little Help needed walking in hospital room?: A Little Help needed climbing 3-5 steps with a railing? : A Lot 6 Click Score: 17    End of Session Equipment Utilized During Treatment: Gait belt Activity Tolerance: Patient limited by pain Patient left: in chair;with call bell/phone within reach Nurse Communication: Mobility status PT Visit Diagnosis: Difficulty in walking, not elsewhere classified (R26.2);Other  symptoms and signs involving the nervous system (R29.898)    Time: 9758-8325 PT Time Calculation (min) (ACUTE ONLY): 27 min   Charges:   PT Evaluation $PT Eval Moderate Complexity: 1 Mod     PT G Codes:        Alben Deeds, PT DPT  Board Certified Neurologic Specialist [place] 07/01/2017, 10:17 AM

## 2017-07-02 ENCOUNTER — Other Ambulatory Visit: Payer: Self-pay

## 2017-07-02 ENCOUNTER — Inpatient Hospital Stay (HOSPITAL_COMMUNITY): Payer: Managed Care, Other (non HMO)

## 2017-07-02 ENCOUNTER — Encounter: Payer: Self-pay | Admitting: Neurology

## 2017-07-02 DIAGNOSIS — F419 Anxiety disorder, unspecified: Secondary | ICD-10-CM

## 2017-07-02 DIAGNOSIS — I5030 Unspecified diastolic (congestive) heart failure: Secondary | ICD-10-CM

## 2017-07-02 DIAGNOSIS — I10 Essential (primary) hypertension: Secondary | ICD-10-CM

## 2017-07-02 DIAGNOSIS — I639 Cerebral infarction, unspecified: Secondary | ICD-10-CM

## 2017-07-02 HISTORY — DX: Essential (primary) hypertension: I10

## 2017-07-02 HISTORY — DX: Anxiety disorder, unspecified: F41.9

## 2017-07-02 LAB — CBC
HCT: 38 % (ref 36.0–46.0)
Hemoglobin: 13 g/dL (ref 12.0–15.0)
MCH: 29.6 pg (ref 26.0–34.0)
MCHC: 34.2 g/dL (ref 30.0–36.0)
MCV: 86.6 fL (ref 78.0–100.0)
Platelets: 270 10*3/uL (ref 150–400)
RBC: 4.39 MIL/uL (ref 3.87–5.11)
RDW: 14.6 % (ref 11.5–15.5)
WBC: 7.3 10*3/uL (ref 4.0–10.5)

## 2017-07-02 LAB — ECHOCARDIOGRAM COMPLETE
Height: 59 in
WEIGHTICAEL: 2941.82 [oz_av]

## 2017-07-02 LAB — BASIC METABOLIC PANEL
Anion gap: 9 (ref 5–15)
BUN: 8 mg/dL (ref 6–20)
CHLORIDE: 104 mmol/L (ref 101–111)
CO2: 22 mmol/L (ref 22–32)
CREATININE: 0.75 mg/dL (ref 0.44–1.00)
Calcium: 8.9 mg/dL (ref 8.9–10.3)
GFR calc Af Amer: >60 mL/min (ref >60)
GFR calc non Af Amer: >60 mL/min (ref >60)
Glucose, Bld: 91 mg/dL (ref 65–99)
Potassium: 3.8 mmol/L (ref 3.5–5.1)
Sodium: 135 mmol/L (ref 135–145)

## 2017-07-02 MED ORDER — LISINOPRIL 20 MG PO TABS
20.0000 mg | ORAL_TABLET | Freq: Every day | ORAL | 6 refills | Status: DC
Start: 2017-07-03 — End: 2020-10-06

## 2017-07-02 MED ORDER — ASPIRIN 81 MG PO TBEC: 81.0000 mg | DELAYED_RELEASE_TABLET | Freq: Every day | ORAL | Status: DC

## 2017-07-02 NOTE — Discharge Instructions (Signed)
Stroke Prevention Some health problems and behaviors may make it more likely for you to have a stroke. Below are ways to lessen your risk of having a stroke.  Be active for at least 30 minutes on most or all days.  Do not smoke. Try not to be around others who smoke.  Do not drink too much alcohol. ? Do not have more than 2 drinks a day if you are a man. ? Do not have more than 1 drink a day if you are a woman and are not pregnant.  Eat healthy foods, such as fruits and vegetables. If you were put on a specific diet, follow the diet as told.  Keep your cholesterol levels under control through diet and medicines. Look for foods that are low in saturated fat, trans fat, cholesterol, and are high in fiber.  If you have diabetes, follow all diet plans and take your medicine as told.  Ask your doctor if you need treatment to lower your blood pressure. If you have high blood pressure (hypertension), follow all diet plans and take your medicine as told by your doctor.  If you are 109-31 years old, have your blood pressure checked every 3-5 years. If you are age 22 or older, have your blood pressure checked every year.  Keep a healthy weight. Eat foods that are low in calories, salt, saturated fat, trans fat, and cholesterol.  Do not take drugs.  Avoid birth control pills, if this applies. Talk to your doctor about the risks of taking birth control pills.  Talk to your doctor if you have sleep problems (sleep apnea).  Take all medicine as told by your doctor. ? You may be told to take aspirin or blood thinner medicine. Take this medicine as told by your doctor. ? Understand your medicine instructions.  Make sure any other conditions you have are being taken care of.  Get help right away if:  You suddenly lose feeling (you feel numb) or have weakness in your face, arm, or leg.  Your face or eyelid hangs down to one side.  You suddenly feel confused.  You have trouble talking  (aphasia) or understanding what people are saying.  You suddenly have trouble seeing in one or both eyes.  You suddenly have trouble walking.  You are dizzy.  You lose your balance or your movements are clumsy (uncoordinated).  You suddenly have a very bad headache and you do not know the cause.  You have new chest pain.  Your heart feels like it is fluttering or skipping a beat (irregular heartbeat). Do not wait to see if the symptoms above go away. Get help right away. Call your local emergency services (911 in [place] Do not drive yourself to the hospital. This information is not intended to replace advice given to you by your health care provider. Make sure you discuss any questions you have with your health care provider. Document Released: 02/14/2012 Document Revised: 01/21/2016 Document Reviewed: 02/15/2013 Elsevier Interactive Patient Education  2018 [employer]. Managing Your Hypertension Hypertension is commonly called high blood pressure. This is when the force of your blood pressing against the walls of your arteries is too strong. Arteries are blood vessels that carry blood from your heart throughout your body. Hypertension forces the heart to work harder to pump blood, and may cause the arteries to become narrow or stiff. Having untreated or uncontrolled hypertension can cause heart attack, stroke, kidney disease, and other problems. What are blood pressure readings?  A blood pressure reading consists of a higher number over a lower number. Ideally, your blood pressure should be below 120/80. The first ("top") number is called the systolic pressure. It is a measure of the pressure in your arteries as your heart beats. The second ("bottom") number is called the diastolic pressure. It is a measure of the pressure in your arteries as the heart relaxes. What does my blood pressure reading mean? Blood pressure is classified into four stages. Based on your blood pressure reading, your  health care provider may use the following stages to determine what type of treatment you need, if any. Systolic pressure and diastolic pressure are measured in a unit called mm Hg. Normal  Systolic pressure: below 350.  Diastolic pressure: below 80. Elevated  Systolic pressure: 093-818.  Diastolic pressure: below 80. Hypertension stage 1  Systolic pressure: 299-371.  Diastolic pressure: 69-67. Hypertension stage 2  Systolic pressure: 893 or above.  Diastolic pressure: 90 or above. What health risks are associated with hypertension? Managing your hypertension is an important responsibility. Uncontrolled hypertension can lead to:  A heart attack.  A stroke.  A weakened blood vessel (aneurysm).  Heart failure.  Kidney damage.  Eye damage.  Metabolic syndrome.  Memory and concentration problems.  What changes can I make to manage my hypertension? Hypertension can be managed by making lifestyle changes and possibly by taking medicines. Your health care provider will help you make a plan to bring your blood pressure within a normal range. Eating and drinking  Eat a diet that is high in fiber and potassium, and low in salt (sodium), added sugar, and fat. An example eating plan is called the DASH (Dietary Approaches to Stop Hypertension) diet. To eat this way: ? Eat plenty of fresh fruits and vegetables. Try to fill half of your plate at each meal with fruits and vegetables. ? Eat whole grains, such as whole wheat pasta, brown rice, or whole grain bread. Fill about one quarter of your plate with whole grains. ? Eat low-fat diary products. ? Avoid fatty cuts of meat, processed or cured meats, and poultry with skin. Fill about one quarter of your plate with lean proteins such as fish, chicken without skin, beans, eggs, and tofu. ? Avoid premade and processed foods. These tend to be higher in sodium, added sugar, and fat.  Reduce your daily sodium intake. Most people with  hypertension should eat less than 1,500 mg of sodium a day.  Limit alcohol intake to no more than 1 drink a day for nonpregnant women and 2 drinks a day for men. One drink equals 12 oz of beer, 5 oz of wine, or 1 oz of hard liquor. Lifestyle  Work with your health care provider to maintain a healthy body weight, or to lose weight. Ask what an ideal weight is for you.  Get at least 30 minutes of exercise that causes your heart to beat faster (aerobic exercise) most days of the week. Activities may include walking, swimming, or biking.  Include exercise to strengthen your muscles (resistance exercise), such as weight lifting, as part of your weekly exercise routine. Try to do these types of exercises for 30 minutes at least 3 days a week.  Do not use any products that contain nicotine or tobacco, such as cigarettes and e-cigarettes. If you need help quitting, ask your health care provider.  Control any long-term (chronic) conditions you have, such as high cholesterol or diabetes. Monitoring  Monitor your blood pressure at  home as told by your health care provider. Your personal target blood pressure may vary depending on your medical conditions, your age, and other factors.  Have your blood pressure checked regularly, as often as told by your health care provider. Working with your health care provider  Review all the medicines you take with your health care provider because there may be side effects or interactions.  Talk with your health care provider about your diet, exercise habits, and other lifestyle factors that may be contributing to hypertension.  Visit your health care provider regularly. Your health care provider can help you create and adjust your plan for managing hypertension. Will I need medicine to control my blood pressure? Your health care provider may prescribe medicine if lifestyle changes are not enough to get your blood pressure under control, and if:  Your systolic  blood pressure is 130 or higher.  Your diastolic blood pressure is 80 or higher.  Take medicines only as told by your health care provider. Follow the directions carefully. Blood pressure medicines must be taken as prescribed. The medicine does not work as well when you skip doses. Skipping doses also puts you at risk for problems. Contact a health care provider if:  You think you are having a reaction to medicines you have taken.  You have repeated (recurrent) headaches.  You feel dizzy.  You have swelling in your ankles.  You have trouble with your vision. Get help right away if:  You develop a severe headache or confusion.  You have unusual weakness or numbness, or you feel faint.  You have severe pain in your chest or abdomen.  You vomit repeatedly.  You have trouble breathing. Summary  Hypertension is when the force of blood pumping through your arteries is too strong. If this condition is not controlled, it may put you at risk for serious complications.  Your personal target blood pressure may vary depending on your medical conditions, your age, and other factors. For most people, a normal blood pressure is less than 120/80.  Hypertension is managed by lifestyle changes, medicines, or both. Lifestyle changes include weight loss, eating a healthy, low-sodium diet, exercising more, and limiting alcohol. This information is not intended to replace advice given to you by your health care provider. Make sure you discuss any questions you have with your health care provider. Document Released: 05/09/2012 Document Revised: 07/13/2016 Document Reviewed: 07/13/2016 Elsevier Interactive Patient Education  2018 [employer].    1. Outpatient physical and occupational therapy will be scheduled. 2. An outpatient transesophageal echocardiogram will be scheduled. 3. 24-hour per day supervision / assistance recommended initially after discharge.

## 2017-07-02 NOTE — Progress Notes (Signed)
Occupational Therapy Evaluation and Treatment Patient Details Name: [name] MRN: [id number] DOB: 1975-11-22 Today's Date: 07/02/2017    History of Present Illness 41 year old female with acute onset left-sided weakness and numbness most consistent with small ischemic infarct.  She has been treated with IV TPA   Clinical Impression   PTA,  Pt lived with her husband and was independent with mobility and ADL and worked in Sales executive. Pt presents with mild impairment with LUE fine motor and coordination skills due to apparent sensorimotor deficits. At this time recommend pt DC home when medically stable with intermittent S and follow up with outpt OT at the neuro outpt center to maximize functional use of LUE to facilitate return to work. Pt in agreement. All further OT to be addressed in next venue of care.     Follow Up Recommendations  Outpatient OT;Supervision - Intermittent(neuro outpt)    Equipment Recommendations  Tub/shower seat(pt declined)    Recommendations for Other Services       Precautions / Restrictions Precautions Precautions: Fall Restrictions Weight Bearing Restrictions: No      Mobility Bed Mobility Overal bed mobility: Modified Independent                Transfers Overall transfer level: Modified independent                    Balance     Sitting balance-Leahy Scale: Good       Standing balance-Leahy Scale: Fair Standing balance comment: Pt ableto make quick turns when walking. Complains to dizziness when bending forward                           ADL either performed or assessed with clinical judgement   ADL Overall ADL's : Needs assistance/impaired Eating/Feeding: Independent   Grooming: Modified independent   Upper Body Bathing: Modified independent   Lower Body Bathing: Supervison/ safety;Sit to/from stand   Upper Body Dressing : Modified independent   Lower Body Dressing: Supervision/safety;Sit to/from  stand   Toilet Transfer: Supervision/safety   Toileting- Water quality scientist and Hygiene: Modified independent       Functional mobility during ADLs: Supervision/safety General ADL Comments: Pt complains of dizziness when bending over. Discussed compensatory strageties to avoid these positions during ADL. Recommended shower chair to reduce ris of falling/avoid bending over to bath feet at this time. Pt verbalized understanding. Educated on reducing risk of falls at home - handout reviewed     Vision Baseline Vision/History: Wears glasses Wears Glasses: At all times Patient Visual Report: No change from baseline Vision Assessment?: No apparent visual deficits     Perception Perception Perception Tested?: (no apparent deficits)   Praxis Praxis Praxis tested?: Within functional limits    Pertinent Vitals/Pain Pain Assessment: 0-10 Pain Score: 2  Pain Location: headache Pain Descriptors / Indicators: Aching Pain Intervention(s): Limited activity within patient's tolerance     Hand Dominance Right   Extremity/Trunk Assessment Upper Extremity Assessment Upper Extremity Assessment: LUE deficits/detail LUE Deficits / Details: weaker distally than proximally; grip strength @ 4/5. Decreased fine motor/coordination; slowed response with BUE integrated activities LUE Sensation: decreased light touch LUE Coordination: decreased fine motor   Lower Extremity Assessment Lower Extremity Assessment: Defer to PT evaluation LLE Deficits / Details: Per PT - noted assymetrical weakness, 4-/5 gross motions, increased edema note LLE, pain reported LLE Sensation: decreased light touch LLE Coordination: decreased fine motor   Cervical / Trunk Assessment Cervical /  Trunk Assessment: Normal   Communication Communication Communication: No difficulties   Cognition Arousal/Alertness: Awake/alert Behavior During Therapy: Flat affect Overall Cognitive Status: Within Functional Limits for  tasks assessed                                 General Comments: appears WFL; may benefit form further assessment of higher level cognitive skills   General Comments       Exercises Exercises: Other exercises Other Exercises Other Exercises: level 2 theraputty grip and pinch strengthening exercises Other Exercises:  fine motor control/coordination activities - handout reviewed   Shoulder Instructions      Home Living Family/patient expects to be discharged to:: Private residence Living Arrangements: Spouse/significant other Available Help at Discharge: Family Type of Home: House Home Access: Stairs to enter [employer] of Steps: 3 Entrance Stairs-Rails: None Home Layout: Two level;Able to live on main level with bedroom/bathroom     Bathroom Shower/Tub: Teacher, early years/pre: Standard Bathroom Accessibility: Yes How Accessible: Accessible via walker Home Equipment: None      Lives With: Spouse    Prior Functioning/Environment Level of Independence: Independent        Comments: 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        OT Problem List: Decreased strength;Decreased coordination;Impaired sensation;Obesity;Impaired UE functional use;Pain      OT Treatment/Interventions:      OT Goals(Current goals can be found in the care plan section) Acute Rehab OT Goals Patient Stated Goal: to get better OT Goal Formulation: All assessment and education complete, DC therapy  OT Frequency:     Barriers to D/C:            Co-evaluation              AM-PAC PT "6 Clicks" Daily Activity     Outcome Measure Help from another person eating meals?: None Help from another person taking care of personal grooming?: None Help from another person toileting, which includes using toliet, bedpan, or urinal?: A Little Help from another person bathing (including washing, rinsing, drying)?:  None Help from another person to put on and taking off regular upper body clothing?: None Help from another person to put on and taking off regular lower body clothing?: None 6 Click Score: 23   End of Session Nurse Communication: Mobility status  Activity Tolerance: Patient tolerated treatment well Patient left: in chair;with call bell/phone within reach;with family/visitor present  OT Visit Diagnosis: Unsteadiness on feet (R26.81);Muscle weakness (generalized) (M62.81);Dizziness and giddiness (R42)                Time: 1135-1150 OT Time Calculation (min): 15 min 2nd visit: 4270 - 1230 OT Time: 10 min Charges:  OT General Charges $OT Visit: (2 visits) OT Evaluation $OT Eval Low Complexity: 1 Low OT Treatments $Therapeutic Activity: 8-22 mins G-Codes:     [facility], OT/L  [phone] 07/02/2017  Jasmynn Pfalzgraf,HILLARY 07/02/2017, 12:34 PM

## 2017-07-02 NOTE — Evaluation (Signed)
Speech Language Pathology Evaluation Patient Details Name: [name] MRN: [id number] DOB: 06/16/1976 Today's Date: 07/02/2017 Time: 6803-2122 SLP Time Calculation (min) (ACUTE ONLY): 15 min  Problem List:  Patient Active Problem List   Diagnosis Date Noted  . 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 Bell's palsy    Past Surgical History:  Past Surgical History:  Procedure Laterality Date  . CHOLECYSTECTOMY    . TUBAL LIGATION     HPI:  41 year old female with acute onset left-sided weakness and numbness most consistent with small ischemic infarct. She has been treated with IV TPA. MRI/CT negative for acute infarct. PT noted flat affect.    Assessment / Plan / Recommendation Clinical Impression  Patient presents with mild cognitive communication impairment. Administered MOCA form 7.1; pt scored 22/30 (>26 is normal). Noted with slowed processing, impaired memory impacting delayed recall (2/5), working memory as seen with digit and sentence repetition. Pt aware of deficits, stating, "I'm having trouble with my memory." Works in Art therapist for [employer]; would benefit from Long, supervision upon d/c to maximize cognition for return to work and for safety awareness. Education completed, SLP will s/o.     SLP Assessment  SLP Recommendation/Assessment: All further Speech Lanaguage Pathology  needs can be addressed in the next venue of care SLP Visit Diagnosis: Cognitive communication deficit (R41.841)    Follow Up Recommendations  Home health SLP    Frequency and Duration           SLP Evaluation Cognition  Overall Cognitive Status: Impaired/Different from baseline Arousal/Alertness: Awake/alert Orientation Level: Oriented X4 Attention: Focused;Sustained Focused Attention: Appears intact Sustained Attention: Appears intact Memory: Impaired Memory Impairment: Decreased recall of new  information;Decreased short term memory Decreased Short Term Memory: Verbal basic;Functional basic Awareness: Appears intact Problem Solving: Impaired Problem Solving Impairment: Verbal complex;Functional complex Executive Function: Sequencing;Decision Radio broadcast assistant: Impaired Sequencing Impairment: Verbal complex;Functional complex Organizing: Impaired Organizing Impairment: Verbal complex;Functional complex Decision Making: Impaired Decision Making Impairment: Verbal complex;Functional complex Safety/Judgment: Impaired       Comprehension  Auditory Comprehension Overall Auditory Comprehension: Appears within functional limits for tasks assessed Visual Recognition/Discrimination Discrimination: Within Function Limits Reading Comprehension Reading Status: Not tested    Expression Expression Primary Mode of Expression: Verbal Verbal Expression Overall Verbal Expression: Appears within functional limits for tasks assessed Initiation: No impairment Automatic Speech: Name;Social Response Level of Generative/Spontaneous Verbalization: Sentence Repetition: Impaired Level of Impairment: Sentence level Naming: No impairment Pragmatics: Impairment Impairments: Abnormal affect;Monotone Written Expression Dominant Hand: Right Written Expression: Not tested   Oral / Motor  Oral Motor/Sensory Function Overall Oral Motor/Sensory Function: Other (comment)(reports reduced L facial sensation) Motor Speech Overall Motor Speech: Appears within functional limits for tasks assessed Respiration: Within functional limits Phonation: Normal Resonance: Within functional limits Articulation: Within functional limitis Intelligibility: Intelligible Motor Planning: Witnin functional limits Motor Speech Errors: Not applicable   [place], [place], [place] Pathologist [phone]  Aliene Altes 07/02/2017, 11:52 AM

## 2017-07-02 NOTE — Progress Notes (Signed)
Bilateral lower extremity venous has been completed. Negative for DVT.  07/02/17 9:38 AM [name] RVT

## 2017-07-02 NOTE — Progress Notes (Signed)
Patient with improvements in mobility and activity tolerance. Continues to show some LLE deficits. Updated POC for outpatient therapies.     07/02/17 1400  PT Visit Information  Last PT Received On 07/02/17  Assistance Needed +1  History of Present Illness 41 year old female with acute onset left-sided weakness and numbness most consistent with small ischemic infarct.  She has been treated with IV TPA  Subjective Data  Patient Stated Goal to get better  Precautions  Precautions Fall  Restrictions  Weight Bearing Restrictions No  Pain Assessment  Pain Assessment 0-10  Pain Score 2  Pain Location headache  Pain Descriptors / Indicators Aching  Pain Intervention(s) Monitored during session  Cognition  Arousal/Alertness Awake/alert  Behavior During Therapy Flat affect  Overall Cognitive Status Within Functional Limits for tasks assessed  General Comments appears WFL  Bed Mobility  Overal bed mobility Modified Independent  Transfers  Overall transfer level Modified independent  Ambulation/Gait  Ambulation/Gait assistance Modified independent (Device/Increase time)  Assistive device None  Gait Pattern/deviations Antalgic  General Gait Details improved gait pattern noted, some residual LLE weakness and decreased activity tolerance  Gait velocity decreased  Gait velocity interpretation Below normal speed for age/gender  Stairs Yes  Stairs assistance Supervision  Stair Management One rail Right  Number of Stairs 8  General stair comments VCs for sequencing re:LLE weakness  Balance  Sitting balance-Leahy Scale Good  Standing balance-Leahy Scale Fair  Other Exercises  Other Exercises quad sets  Other Exercises LAQs  PT - End of Session  Equipment Utilized During Treatment Gait belt  Activity Tolerance Patient limited by pain  Patient left in chair;with call bell/phone within reach  Nurse Communication Mobility status  PT - Assessment/Plan  PT Plan Discharge plan needs to be  updated  PT Visit Diagnosis Difficulty in walking, not elsewhere classified (R26.2);Other symptoms and signs involving the nervous system (R29.898)  PT Frequency (ACUTE ONLY) Min 4X/week  Follow Up Recommendations Outpatient PT;Supervision - Intermittent  PT equipment None recommended by PT  AM-PAC PT "6 Clicks" Daily Activity Outcome Measure  Difficulty turning over in bed (including adjusting bedclothes, sheets and blankets)? 4  Difficulty moving from lying on back to sitting on the side of the bed?  4  Difficulty sitting down on and standing up from a chair with arms (e.g., wheelchair, bedside commode, etc,.)? 4  Help needed moving to and from a bed to chair (including a wheelchair)? 4  Help needed walking in hospital room? 3  Help needed climbing 3-5 steps with a railing?  3  6 Click Score 22  Mobility G Code  CJ  PT Goal Progression  Progress towards PT goals Progressing toward goals  Acute Rehab PT Goals  PT Goal Formulation With patient  Time For Goal Achievement 07/15/17  Potential to Achieve Goals Good  PT Time Calculation  PT Start Time (ACUTE ONLY) 1150  PT Stop Time (ACUTE ONLY) 1201  PT Time Calculation (min) (ACUTE ONLY) 11 min  PT General Charges  $$ ACUTE PT VISIT 1 Visit  PT Treatments  $Gait Training 8-22 mins    Alben Deeds, PT DPT  Board Certified Neurologic Specialist [phone]

## 2017-07-02 NOTE — Progress Notes (Signed)
  Echocardiogram 2D Echocardiogram has been performed.  [name] 07/02/2017, 9:55 AM

## 2017-07-02 NOTE — Discharge Summary (Signed)
Stroke Discharge Summary  Patient ID: [name]   MRN: [id number]      DOB: 12-31-1975  Date of Admission: 06/30/2017 Date of Discharge: 07/02/2017  Attending Physician:  Rosalin Hawking, MD, Stroke MD Consultant(s):   Treatment Team:  Stroke, Md, MD None  Patient's PCP:  System, Pcp Not In  DISCHARGE DIAGNOSIS: Suspected stroke treated with TPA Active Problems:   Anxiety    HTN   ?? MV echodensity   Past Medical History:  Diagnosis Date  . Bell's palsy    Past Surgical History:  Procedure Laterality Date  . CHOLECYSTECTOMY    . TUBAL LIGATION      Allergies as of 07/02/2017      Reactions   Erythromycin Rash      Medication List    TAKE these medications   aspirin 81 MG EC tablet Take 1 tablet (81 mg total) daily by mouth. Start taking on:  07/03/2017   lisinopril 20 MG tablet Commonly known as:  PRINIVIL,ZESTRIL Take 1 tablet (20 mg total) daily by mouth. Start taking on:  07/03/2017   mineral oil enema Place 1 enema rectally once.   norethindrone 5 MG tablet Commonly known as:  AYGESTIN Take by mouth daily.       LABORATORY STUDIES CBC    Component Value Date/Time   WBC 7.3 07/02/2017 0359   RBC 4.39 07/02/2017 0359   HGB 13.0 07/02/2017 0359   HCT 38.0 07/02/2017 0359   PLT 270 07/02/2017 0359   MCV 86.6 07/02/2017 0359   MCH 29.6 07/02/2017 0359   MCHC 34.2 07/02/2017 0359   RDW 14.6 07/02/2017 0359   LYMPHSABS 2.9 06/30/2017 1825   MONOABS 0.6 06/30/2017 1825   EOSABS 0.2 06/30/2017 1825   BASOSABS 0.0 06/30/2017 1825   CMP    Component Value Date/Time   NA 135 07/02/2017 0359   K 3.8 07/02/2017 0359   CL 104 07/02/2017 0359   CO2 22 07/02/2017 0359   GLUCOSE 91 07/02/2017 0359   BUN 8 07/02/2017 0359   CREATININE 0.75 07/02/2017 0359   CALCIUM 8.9 07/02/2017 0359   PROT 7.7 06/30/2017 1825   ALBUMIN 3.6 06/30/2017 1825   AST 21 06/30/2017 1825   ALT 20 06/30/2017 1825   ALKPHOS 63 06/30/2017 1825   BILITOT 0.4 06/30/2017  1825   GFRNONAA >60 07/02/2017 0359   GFRAA >60 07/02/2017 0359   COAGS Lab Results  Component Value Date   INR 1.04 06/30/2017   Lipid Panel    Component Value Date/Time   CHOL 134 07/01/2017 0444   TRIG 58 07/01/2017 0444   HDL 28 (L) 07/01/2017 0444   CHOLHDL 4.8 07/01/2017 0444   VLDL 12 07/01/2017 0444   LDLCALC 94 07/01/2017 0444   HgbA1C  Lab Results  Component Value Date   HGBA1C 5.4 07/01/2017   Urinalysis    Component Value Date/Time   COLORURINE STRAW (A) 06/30/2017 2130   APPEARANCEUR CLEAR 06/30/2017 2130   LABSPEC 1.013 06/30/2017 2130   PHURINE 6.0 06/30/2017 2130   GLUCOSEU NEGATIVE 06/30/2017 2130   HGBUR NEGATIVE 06/30/2017 2130   BILIRUBINUR NEGATIVE 06/30/2017 2130   Major NEGATIVE 06/30/2017 2130   PROTEINUR 30 (A) 06/30/2017 2130   UROBILINOGEN 1.0 09/17/2013 1757   NITRITE NEGATIVE 06/30/2017 2130   LEUKOCYTESUR NEGATIVE 06/30/2017 2130   Urine Drug Screen     Component Value Date/Time   LABOPIA NONE DETECTED 06/30/2017 2130   COCAINSCRNUR NONE DETECTED 06/30/2017 2130  LABBENZ NONE DETECTED 06/30/2017 2130   AMPHETMU NONE DETECTED 06/30/2017 2130   THCU NONE DETECTED 06/30/2017 2130   LABBARB NONE DETECTED 06/30/2017 2130    Alcohol Level    Component Value Date/Time   ETH <10 06/30/2017 1825     SIGNIFICANT DIAGNOSTIC STUDIES I have personally reviewed the radiological images below and agree with the radiology interpretations.  Ct Angio Head W Or Wo Contrast Ct Angio Neck W Or Wo Contrast 06/30/2017 IMPRESSION:  1. Patent carotid and vertebral arteries. No dissection, aneurysm, or hemodynamically significant stenosis utilizing NASCET criteria.  2. Patent circle of Willis. No large vessel occlusion, aneurysm, or significant stenosis.   Ct Head Code Stroke Wo Contrast 06/30/2017 IMPRESSION:  1. Negative head CT.  No acute intracranial abnormality identified.  2. ASPECTS is 10.   Mr Brain Wo  Contrast 07/01/2017 IMPRESSION:  1. Normal MRI appearance of the brain. No evidence for acute or subacute infarction.  2. Mild sinus disease.    Transthoracic Echocardiogram  07/02/2017 Study Conclusions - Left ventricle: The cavity size was normal. Wall thickness was   normal. Systolic function was normal. The estimated ejection   fraction was in the range of 50% to 55%. Wall motion was normal;   there were no regional wall motion abnormalities. Doppler   parameters are consistent with abnormal left ventricular   relaxation (grade 1 diastolic dysfunction). Impressions: - Low normal to mildly reduced LV systolic function; mild diastolic   dysfunction; trace MR and TR; cannot R/O small oscillating   density on MV in apical views; suggest TEE to further assess.   Bilateral Lower Extremity Dopplers  07/02/2017 Final Interpretation Right There is no evidence of deep vein thrombosis in the lower extremity. There is no evidence of superficial venous thrombosis Left There is no evidence of deep vein thrombosis in the lower extremity. No superficial venous thrombosis     HISTORY OF PRESENT ILLNESS [name] is a 41 y.o. female with a history of OCP use who presented with left-sided weakness. She also complained of left-sided chest wall pain.  This was very reproducible to palpation just adjacent to the left breast.  LKW: 3:15 PM tpa given?:  Yes, given at [facility] Ms. [name] is a 41 y.o. female with history of Bell's palsy with residual right facial droop and oral contraceptives prior to admission presenting with left sided weakness and left sided chest wall pain.  She received IV t-PA at [facility] Friday, 06/30/2017 at 1900 hrs. And was transferred to [facility] where she was admitted to the Neuro Intensive Care Unit.  Likely anxiety   Resultant subjective mild left-sided numbness, left giveaway weakness, headache  CT head - Negative head CT.  No acute intracranial  abnormality identified.   MRI head - normal  Bilateral LE Dopplers - negative  CTA H&N - no significant stenosis noted.  2D Echo - EF 50 - 55% cannot R/O small oscillating density on MV in apical views; suggest TEE to further assess. - will have pt outpt follow up with cardiology  LDL - 94  HgbA1c - 5.4  VTE prophylaxis - SCDs  Diet regular Room service appropriate? Yes; Fluid consistency: Thin  No antithrombotic prior to admission, now on ASA 81 mg. Continue ASA 81mg  on discharge for cardiac protection. If outpt TEE negative, ASA 81mg  can be discontinued.  Patient counseled to be compliant with her antithrombotic medications  Ongoing aggressive stroke risk factor management  Therapy recommendations:  Outpatient  PT and OT   Disposition: Discharge to home  HTN  BP elevated 150s -160s  Put on lisinopril  Close PCP/cardiology follow-up and BP monitoring at home  Left chest wall pain  Troponin negative  Focal left upper quadrant of breast tenderness on palpation  Likely costochondritis  Tylenol PRN  Other Stroke Risk Factors  ETOH use, advised to drink no more than 1 drink per day.  Obesity, Body mass index is 37.14 kg/m., recommend weight loss, diet and exercise as appropriate   OCP PTA - progesterone only  Other Active Problems  Hypokalemia - supplemented  Oral contraceptive use - progesterone only    DISCHARGE EXAM  Vitals:   07/02/17 0700 07/02/17 0747 07/02/17 0800 07/02/17 1141  BP: (!) 155/103  (!) 145/100 (!) 167/111  Pulse: 78  83 77  Resp: 16  13   Temp:  97.8 F (36.6 C)  98.2 F (36.8 C)  TempSrc:  Oral  Oral  SpO2: 99%  99% 100%  Weight:      Height:        General - Well nourished, well developed, in no apparent distress.  Ophthalmologic - Fundi not visualized due to noncooperation.  Cardiovascular - Regular rate and rhythm.  Mental Status -  Level of arousal and orientation to time, place, and person were  intact. Language including expression, naming, repetition, comprehension was assessed and found intact. Fund of Knowledge was assessed and was intact.  Cranial Nerves II - XII - II - Visual field intact OU. 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 V - Facial sensation subjectivey decreased on the left, but also decreased on the left vibration on the forehead tuning fork testing. VII - chronic right facial mild droop, right eye closure incomplete. VIII - Hearing & vestibular intact bilaterally. X - Palate elevates symmetrically. XI - Chin turning & shoulder shrug intact bilaterally. XII - Tongue protrusion intact.  Motor Strength - The patient's strength was normal in all extremities and pronator drift was absent.  Bulk was normal and fasciculations were absent.   Motor Tone - Muscle tone was assessed at the neck and appendages and was normal.  Reflexes - The patient's reflexes were 1+ in all extremities and she had no pathological reflexes.  Sensory - Light touch, temperature/pinprick were assessed and were subjectively decreased on the left, about 85-95% of the right.    Coordination - The patient had normal movements in the hands with no ataxia or dysmetria.  Tremor was absent.  Gait and Station - normal gait, stance, turns    Discharge Diet   Diet regular Room service appropriate? Yes; Fluid consistency: Thin liquids  DISCHARGE PLAN  Disposition:  Discharged to home  aspirin 81 mg daily for secondary stroke prevention.  Ongoing risk factor control by Primary Care Physician at time of discharge  Follow-up System, Pcp Not In in 2 weeks.  Follow-up with Cecille Rubin, NP, in 6 weeks, office to schedule an appointment.  Outpatient TEE to be scheduled. Cardiology notified.  Outpatient physical and occupational therapy  40 minutes were spent preparing discharge.  Rosalin Hawking, MD PhD Stroke Neurology 07/02/2017 5:45  PM

## 2017-07-11 ENCOUNTER — Encounter: Payer: Self-pay | Admitting: Interventional Cardiology

## 2017-07-11 ENCOUNTER — Encounter (INDEPENDENT_AMBULATORY_CARE_PROVIDER_SITE_OTHER): Payer: Self-pay

## 2017-07-11 ENCOUNTER — Ambulatory Visit (INDEPENDENT_AMBULATORY_CARE_PROVIDER_SITE_OTHER): Payer: Managed Care, Other (non HMO) | Admitting: Interventional Cardiology

## 2017-07-11 VITALS — BP 134/82 | HR 86 | Ht 59.0 in | Wt 187.2 lb

## 2017-07-11 DIAGNOSIS — I10 Essential (primary) hypertension: Secondary | ICD-10-CM | POA: Diagnosis not present

## 2017-07-11 DIAGNOSIS — I059 Rheumatic mitral valve disease, unspecified: Secondary | ICD-10-CM | POA: Diagnosis not present

## 2017-07-11 DIAGNOSIS — R931 Abnormal findings on diagnostic imaging of heart and coronary circulation: Secondary | ICD-10-CM | POA: Diagnosis not present

## 2017-07-11 DIAGNOSIS — I639 Cerebral infarction, unspecified: Secondary | ICD-10-CM | POA: Diagnosis not present

## 2017-07-11 NOTE — H&P (View-Only) (Signed)
Cardiology Office Note   Date:  07/11/2017   ID:  [name], DOB 05-17-1976, MRN [id number]  PCP:  System, Pcp Not In    No chief complaint on file. ? Abnormal echo/mitral valve disorder   Wt Readings from Last 3 Encounters:  07/11/17 187 lb 3.2 oz (84.9 kg)  06/30/17 183 lb 13.8 oz (83.4 kg)       History of Present Illness: [name] is a 41 y.o. female who is being seen today for the evaluation of abnormal echo at the request of Rosalin Hawking, MD. She has had some HTN as well diagnosed in her recent hospital stay in 11/18.  SHe was put on BP meds.  THere was concern for a CVA and she was treated with TPA.  Denies : Chest pain. Dizziness. Leg edema. Nitroglycerin use. Orthopnea. Palpitations. Paroxysmal nocturnal dyspnea. Shortness of breath. Syncope.   There was a question of a mitral valve density. TEE was suggested.   No family h/o heart disease.    Past Medical History:  Diagnosis Date  . Anxiety 07/02/2017  . Bell's palsy   . HTN (hypertension) 07/02/2017  . Stroke (cerebrum) ([place]) 06/30/2017    Past Surgical History:  Procedure Laterality Date  . CHOLECYSTECTOMY    . TUBAL LIGATION       Current Outpatient Medications  Medication Sig Dispense Refill  . aspirin 81 MG EC tablet Take 1 tablet (81 mg total) daily by mouth.    [name] lisinopril (PRINIVIL,ZESTRIL) 20 MG tablet Take 1 tablet (20 mg total) daily by mouth. 30 tablet 6  . norethindrone (AYGESTIN) 5 MG tablet Take by mouth daily.     No current facility-administered medications for this visit.     Allergies:   Erythromycin    Social History:  The patient  reports that  has never smoked. she has never used smokeless tobacco. She reports that she drinks alcohol. She reports that she does not use drugs.   Family History:  The patient's family history is not on file. No early CAD.   ROS:  Please see the history of present illness.   Otherwise, review of systems are positive for .   All other  systems are reviewed and negative.    PHYSICAL EXAM: VS:  BP 134/82   Pulse 86   Ht 4\' 11"  (1.499 m)   Wt 187 lb 3.2 oz (84.9 kg)   SpO2 97%   BMI 37.81 kg/m  , BMI Body mass index is 37.81 kg/m. GEN: Well nourished, well developed, in no acute distress  HEENT: normal  Neck: no JVD, carotid bruits, or masses Cardiac: RRR; no murmurs, rubs, or gallops,no edema  Respiratory:  clear to auscultation bilaterally, normal work of breathing GI: soft, nontender, nondistended, + BS MS: no deformity or atrophy  Skin: warm and dry, no rash Neuro:  Strength and sensation are intact Psych: euthymic mood, full affect   EKG:   The ekg ordered on 07/04/17 demonstrates NSR with sinus arrhythmia   Recent Labs: 06/30/2017: ALT 20 07/02/2017: BUN 8; Creatinine, Ser 0.75; Hemoglobin 13.0; Platelets 270; Potassium 3.8; Sodium 135   Lipid Panel    Component Value Date/Time   CHOL 134 07/01/2017 0444   TRIG 58 07/01/2017 0444   HDL 28 (L) 07/01/2017 0444   CHOLHDL 4.8 07/01/2017 0444   VLDL 12 07/01/2017 0444   LDLCALC 94 07/01/2017 0444     Other studies Reviewed: Additional studies/ records that were reviewed today with results  demonstrating: .   ASSESSMENT AND PLAN:  1. Mitral valve disorder/abnormal echo: We spoke about TEE including the risks and benefits and she is agreeable.  Other sources of CVA can be investigated with TEE as well.  All questions answered.   2. HTN: BP controlled. COntinue lisinopril.    Current medicines are reviewed at length with the patient today.  The patient concerns regarding her medicines were addressed.  The following changes have been made:  No change  Labs/ tests ordered today include:  No orders of the defined types were placed in this encounter.   Recommend 150 minutes/week of aerobic exercise Low fat, low carb, high fiber diet recommended  Disposition:   FU for TEE   Signed, Larae Grooms, MD  07/11/2017 4:58 PM    [facility] [address], [place], [place]  [address] Phone: [phone]; Fax: [phone]

## 2017-07-11 NOTE — Progress Notes (Signed)
Cardiology Office Note   Date:  07/11/2017   ID:  [name], DOB Dec 27, 1975, MRN [id number]  PCP:  System, Pcp Not In    No chief complaint on file. ? Abnormal echo/mitral valve disorder   Wt Readings from Last 3 Encounters:  07/11/17 187 lb 3.2 oz (84.9 kg)  06/30/17 183 lb 13.8 oz (83.4 kg)       History of Present Illness: [name] is a 41 y.o. female who is being seen today for the evaluation of abnormal echo at the request of Rosalin Hawking, MD. She has had some HTN as well diagnosed in her recent hospital stay in 11/18.  SHe was put on BP meds.  THere was concern for a CVA and she was treated with TPA.  Denies : Chest pain. Dizziness. Leg edema. Nitroglycerin use. Orthopnea. Palpitations. Paroxysmal nocturnal dyspnea. Shortness of breath. Syncope.   There was a question of a mitral valve density. TEE was suggested.   No family h/o heart disease.    Past Medical History:  Diagnosis Date  . Anxiety 07/02/2017  . Bell's palsy   . HTN (hypertension) 07/02/2017  . 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 CHOLECYSTECTOMY    . TUBAL LIGATION       Current Outpatient Medications  Medication Sig Dispense Refill  . aspirin 81 MG EC tablet Take 1 tablet (81 mg total) daily by mouth.    [name] lisinopril (PRINIVIL,ZESTRIL) 20 MG tablet Take 1 tablet (20 mg total) daily by mouth. 30 tablet 6  . norethindrone (AYGESTIN) 5 MG tablet Take by mouth daily.     No current facility-administered medications for this visit.     Allergies:   Erythromycin    Social History:  The patient  reports that  has never smoked. she has never used smokeless tobacco. She reports that she drinks alcohol. She reports that she does not use drugs.   Family History:  The patient's family history is not on file. No early CAD.   ROS:  Please see the history of present illness.   Otherwise, review of systems are positive for .   All other  systems are reviewed and negative.    PHYSICAL EXAM: VS:  BP 134/82   Pulse 86   Ht 4\' 11"  (1.499 m)   Wt 187 lb 3.2 oz (84.9 kg)   SpO2 97%   BMI 37.81 kg/m  , BMI Body mass index is 37.81 kg/m. GEN: Well nourished, well developed, in no acute distress  HEENT: normal  Neck: no JVD, carotid bruits, or masses Cardiac: RRR; no murmurs, rubs, or gallops,no edema  Respiratory:  clear to auscultation bilaterally, normal work of breathing GI: soft, nontender, nondistended, + BS MS: no deformity or atrophy  Skin: warm and dry, no rash Neuro:  Strength and sensation are intact Psych: euthymic mood, full affect   EKG:   The ekg ordered on 07/04/17 demonstrates NSR with sinus arrhythmia   Recent Labs: 06/30/2017: ALT 20 07/02/2017: BUN 8; Creatinine, Ser 0.75; Hemoglobin 13.0; Platelets 270; Potassium 3.8; Sodium 135   Lipid Panel    Component Value Date/Time   CHOL 134 07/01/2017 0444   TRIG 58 07/01/2017 0444   HDL 28 (L) 07/01/2017 0444   CHOLHDL 4.8 07/01/2017 0444   VLDL 12 07/01/2017 0444   LDLCALC 94 07/01/2017 0444     Other studies Reviewed: Additional studies/ records that were reviewed today with results  demonstrating: .   ASSESSMENT AND PLAN:  1. Mitral valve disorder/abnormal echo: We spoke about TEE including the risks and benefits and she is agreeable.  Other sources of CVA can be investigated with TEE as well.  All questions answered.   2. HTN: BP controlled. COntinue lisinopril.    Current medicines are reviewed at length with the patient today.  The patient concerns regarding her medicines were addressed.  The following changes have been made:  No change  Labs/ tests ordered today include:  No orders of the defined types were placed in this encounter.   Recommend 150 minutes/week of aerobic exercise Low fat, low carb, high fiber diet recommended  Disposition:   FU for TEE   Signed, Larae Grooms, MD  07/11/2017 4:58 PM    [facility] [place], [place], [place]  [address] Phone: [phone]; Fax: [phone]

## 2017-07-11 NOTE — Patient Instructions (Addendum)
Medication Instructions:  Your physician recommends that you continue on your current medications as directed. Please refer to the Current Medication list given to you today.   Labwork: None ordered  Testing/Procedures: Your physician has requested that you have a TEE on 11/30. During a TEE, sound waves are used to create images of your heart. It provides your doctor with information about the size and shape of your heart and how well your heart's chambers and valves are working. In this test, a transducer is attached to the end of a flexible tube that's guided down your throat and into your esophagus (the tube leading from you mouth to your stomach) to get a more detailed image of your heart. You are not awake for the procedure. Please see the instruction sheet given to you today. For further information please visit [email or web address].   Follow-Up: Based on TEE results   Any Other Special Instructions Will Be Listed Below (If Applicable).     If you need a refill on your cardiac medications before your next appointment, please call your pharmacy.

## 2017-07-28 ENCOUNTER — Ambulatory Visit (HOSPITAL_COMMUNITY)
Admission: RE | Admit: 2017-07-28 | Discharge: 2017-07-28 | Disposition: A | Discharge status: Home / Self Care | Payer: Managed Care, Other (non HMO) | Source: Ambulatory Visit | Attending: Cardiology | Admitting: Cardiology

## 2017-07-28 ENCOUNTER — Encounter (HOSPITAL_COMMUNITY): Payer: Self-pay | Admitting: *Deleted

## 2017-07-28 ENCOUNTER — Ambulatory Visit (HOSPITAL_BASED_OUTPATIENT_CLINIC_OR_DEPARTMENT_OTHER): Payer: Managed Care, Other (non HMO)

## 2017-07-28 ENCOUNTER — Encounter (HOSPITAL_COMMUNITY)
Admission: RE | Disposition: A | Discharge status: Home / Self Care | Payer: Self-pay | Source: Ambulatory Visit | Attending: Cardiology

## 2017-07-28 ENCOUNTER — Other Ambulatory Visit: Payer: Self-pay

## 2017-07-28 DIAGNOSIS — R931 Abnormal findings on diagnostic imaging of heart and coronary circulation: Secondary | ICD-10-CM | POA: Diagnosis present

## 2017-07-28 DIAGNOSIS — Z8673 Personal history of transient ischemic attack (TIA), and cerebral infarction without residual deficits: Secondary | ICD-10-CM | POA: Diagnosis not present

## 2017-07-28 DIAGNOSIS — I10 Essential (primary) hypertension: Secondary | ICD-10-CM | POA: Diagnosis not present

## 2017-07-28 DIAGNOSIS — I6389 Other cerebral infarction: Secondary | ICD-10-CM | POA: Diagnosis not present

## 2017-07-28 DIAGNOSIS — F419 Anxiety disorder, unspecified: Secondary | ICD-10-CM | POA: Insufficient documentation

## 2017-07-28 DIAGNOSIS — G459 Transient cerebral ischemic attack, unspecified: Secondary | ICD-10-CM

## 2017-07-28 DIAGNOSIS — Z7982 Long term (current) use of aspirin: Secondary | ICD-10-CM | POA: Insufficient documentation

## 2017-07-28 HISTORY — PX: TEE WITHOUT CARDIOVERSION: SHX5443

## 2017-07-28 SURGERY — ECHOCARDIOGRAM, TRANSESOPHAGEAL
Anesthesia: Moderate Sedation

## 2017-07-28 MED ORDER — SODIUM CHLORIDE 0.9 % IV SOLN: INTRAVENOUS | Status: DC

## 2017-07-28 MED ORDER — BUTAMBEN-TETRACAINE-BENZOCAINE 2-2-14 % EX AERO
INHALATION_SPRAY | CUTANEOUS | Status: DC | PRN
  Administered 2017-07-28: 2 via TOPICAL

## 2017-07-28 MED ORDER — FENTANYL CITRATE (PF) 100 MCG/2ML IJ SOLN
INTRAMUSCULAR | Status: DC | PRN
  Administered 2017-07-28 (×2): 25 ug via INTRAVENOUS

## 2017-07-28 MED ORDER — DIPHENHYDRAMINE HCL 50 MG/ML IJ SOLN
INTRAMUSCULAR | Status: AC
  Filled 2017-07-28: qty 1

## 2017-07-28 MED ORDER — MIDAZOLAM HCL 5 MG/ML IJ SOLN
INTRAMUSCULAR | Status: AC
  Filled 2017-07-28: qty 2

## 2017-07-28 MED ORDER — FENTANYL CITRATE (PF) 100 MCG/2ML IJ SOLN
INTRAMUSCULAR | Status: AC
  Filled 2017-07-28: qty 2

## 2017-07-28 MED ORDER — MIDAZOLAM HCL 10 MG/2ML IJ SOLN
INTRAMUSCULAR | Status: DC | PRN
  Administered 2017-07-28 (×2): 2 mg via INTRAVENOUS

## 2017-07-28 MED ORDER — SODIUM CHLORIDE 0.9 % IV SOLN
INTRAVENOUS | Status: AC | PRN
  Administered 2017-07-28: 500 mL via INTRAVENOUS

## 2017-07-28 NOTE — CV Procedure (Signed)
    Transesophageal Echocardiogram Note  [name] [id number] 06/09/1976  Procedure: Transesophageal Echocardiogram Indications: CVA  Procedure Details Consent: Obtained Time Out: Verified patient identification, verified procedure, site/side was marked, verified correct patient position, special equipment/implants available, Radiology Safety Procedures followed,  medications/allergies/relevent history reviewed, required imaging and test results available.  Performed  Medications:  During this procedure the patient is administered a total of Versed 4 mg and Fentanyl 50 mcg  to achieve and maintain moderate conscious sedation.  The patient's heart rate, blood pressure, and oxygen saturation are monitored continuously during the procedure. The period of conscious sedation is 30 minutes, of which I was present face-to-face 100% of this time.  Low normal LV function; no LAA thrombus; negative saline microcavitation study.   Complications: No apparent complications Patient did tolerate procedure well.  Kirk Ruths, MD

## 2017-07-28 NOTE — Progress Notes (Signed)
  Echocardiogram Echocardiogram Transesophageal has been performed.  [name] 07/28/2017, 11:58 AM

## 2017-07-28 NOTE — Discharge Instructions (Signed)

## 2017-07-28 NOTE — Interval H&P Note (Signed)
History and Physical Interval Note:  07/28/2017 9:08 AM  [name]  has presented today for surgery, with the diagnosis of ABNORMAL TTE  The various methods of treatment have been discussed with the patient and family. After consideration of risks, benefits and other options for treatment, the patient has consented to  Procedure(s): TRANSESOPHAGEAL ECHOCARDIOGRAM (TEE) (N/A) as a surgical intervention .  The patient's history has been reviewed, patient examined, no change in status, stable for surgery.  I have reviewed the patient's chart and labs.  Questions were answered to the patient's satisfaction.     Kirk Ruths

## 2017-07-29 ENCOUNTER — Encounter (HOSPITAL_COMMUNITY): Payer: Self-pay | Admitting: Cardiology

## 2017-11-10 DIAGNOSIS — S82872A Displaced pilon fracture of left tibia, initial encounter for closed fracture: Secondary | ICD-10-CM | POA: Insufficient documentation

## 2018-10-12 DIAGNOSIS — M19172 Post-traumatic osteoarthritis, left ankle and foot: Secondary | ICD-10-CM | POA: Insufficient documentation

## 2019-03-21 IMAGING — MR MR HEAD W/O CM
8 of 10 series · 36 of 48 positions shown · non-contrast
Comparison: CTA head and neck 06/30/2017.

CLINICAL DATA: Stroke, follow-up.  Status post tPA.

EXAM:
MRI HEAD WITHOUT CONTRAST
TECHNIQUE: Multiplanar, multiecho pulse sequences of the brain and surrounding
structures were obtained without intravenous contrast.

[Series 3: DWI · axial · 3.0mm · 1.09mm/px · z∈[-88,+41]mm · 9 of 88 slices shown (1 of 4)]
[im 1/88]
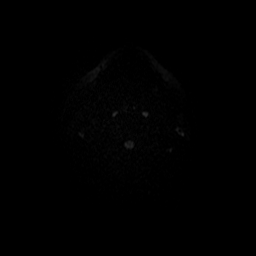
[im 11/88]
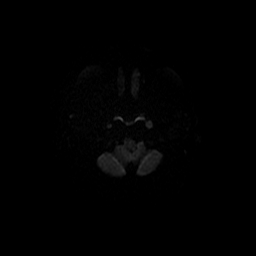
[im 22/88]
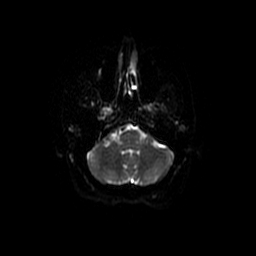
[im 33/88]
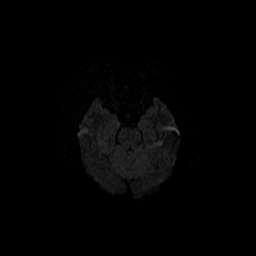
[im 44/88]
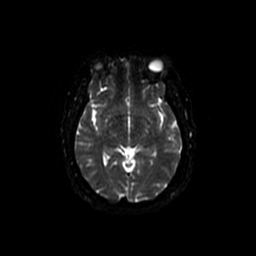
[im 55/88]
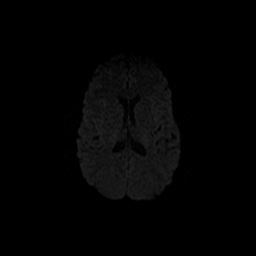
[im 66/88]
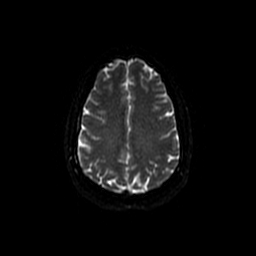
[im 77/88]
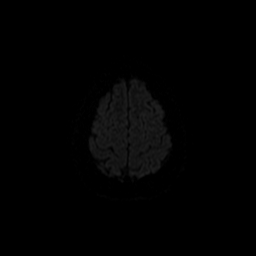
[im 88/88]
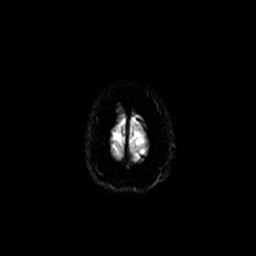

[Series 4: DWI · coronal · 5.0mm · 1.09mm/px · 7 of 70 slices shown (2 of 4)]
[im 1/70]
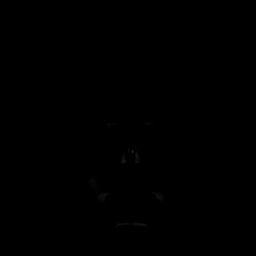
[im 12/70]
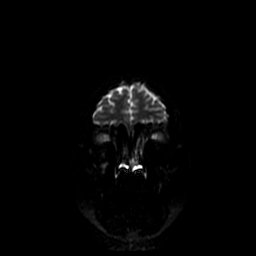
[im 24/70]
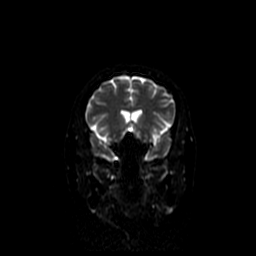
[im 35/70]
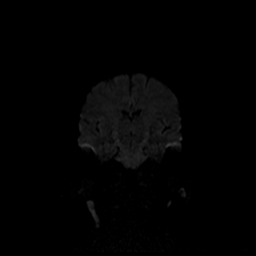
[im 47/70]
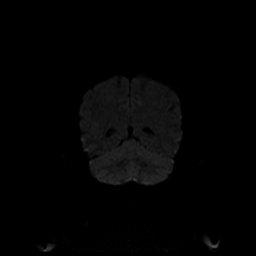
[im 58/70]
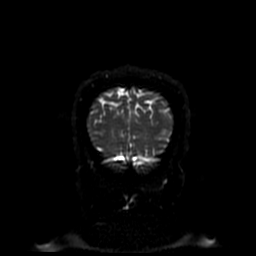
[im 70/70]
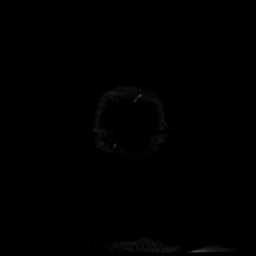

[Series 5: T1 · sagittal · 5.0mm · 0.47mm/px · 2 of 23 slices shown]
[im 1/23]
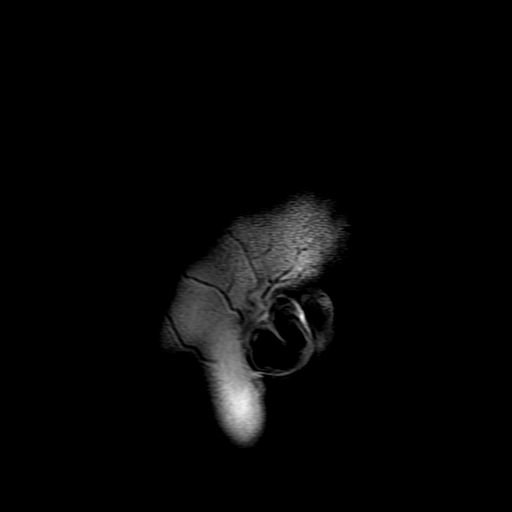
[im 23/23]
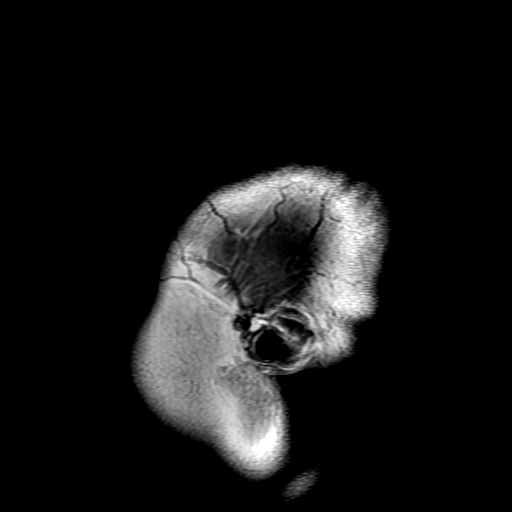

[Series 6: T2 · axial · 5.0mm · 0.43mm/px · z∈[-103,+35]mm · 3 of 24 slices shown (1 of 2)]
[im 1/24]
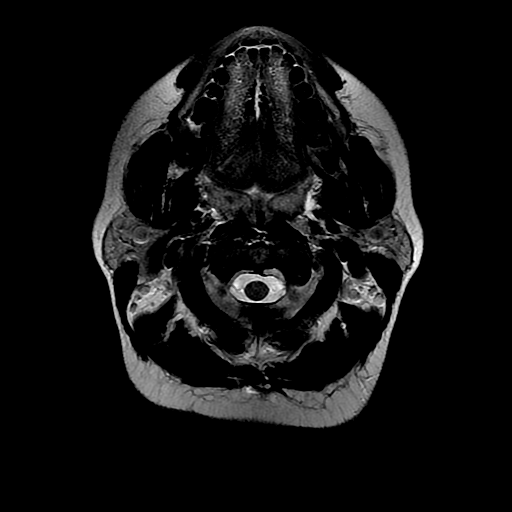
[im 12/24]
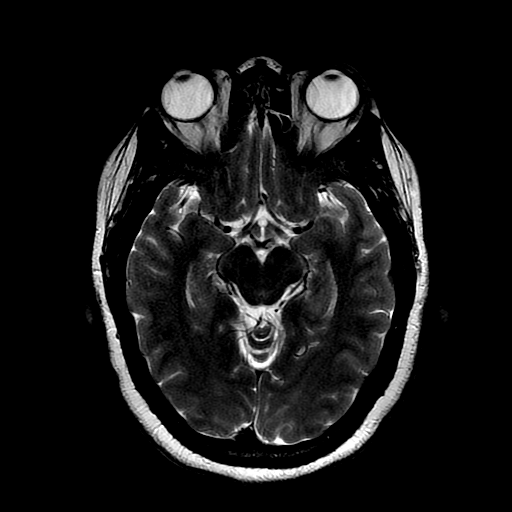
[im 24/24]
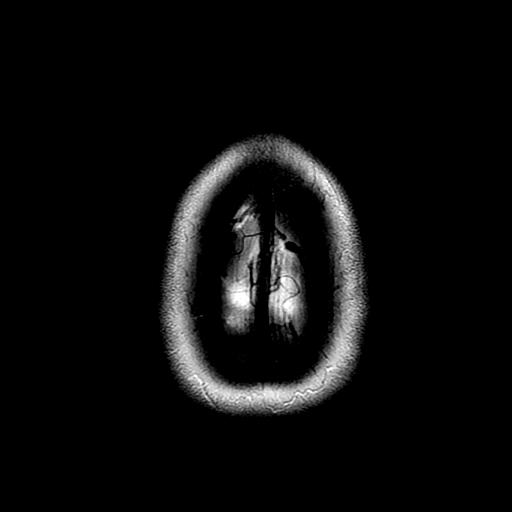

[Series 8: FLAIR · axial · 5.0mm · 0.43mm/px · z∈[-103,+35]mm · 3 of 24 slices shown]
[im 1/24]
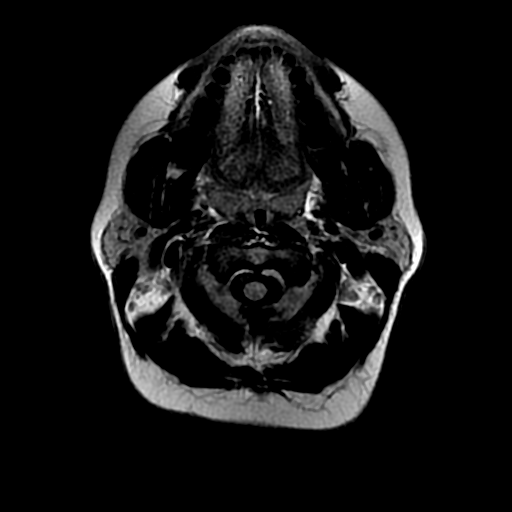
[im 12/24]
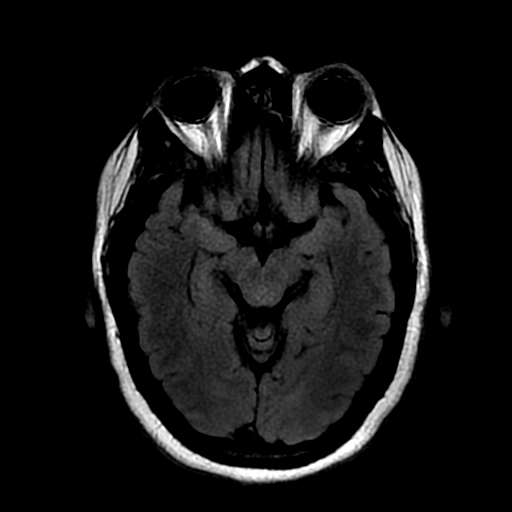
[im 24/24]
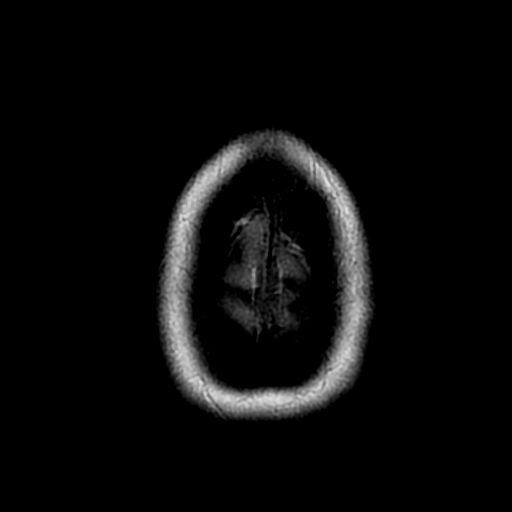

[Series 10: T2 · coronal · 5.0mm · 0.43mm/px · 3 of 24 slices shown (2 of 2)]
[im 1/24]
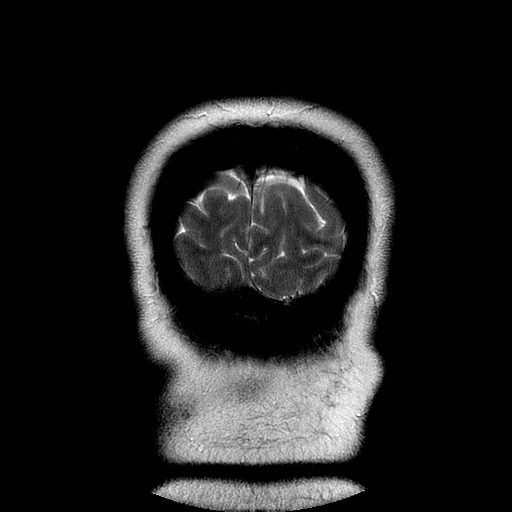
[im 12/24]
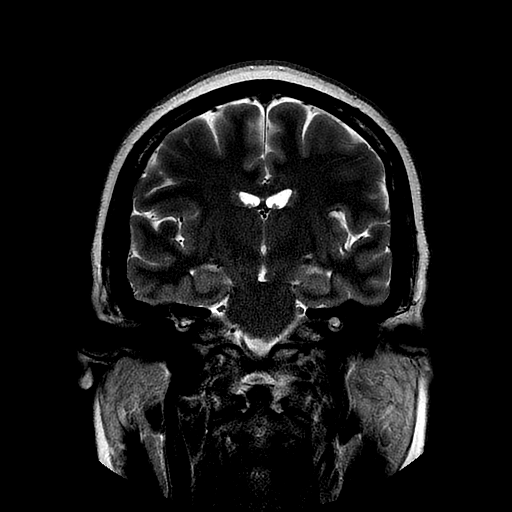
[im 24/24]
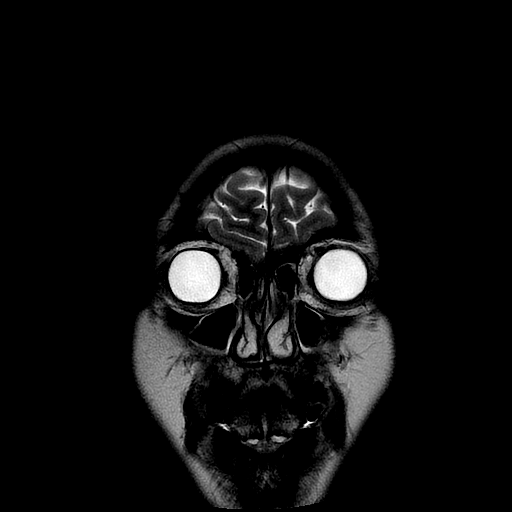

[Series 300: DWI · axial · 3.0mm · 1.09mm/px · z∈[-88,+41]mm · 5 of 44 slices shown (3 of 4)]
[im 1/44]
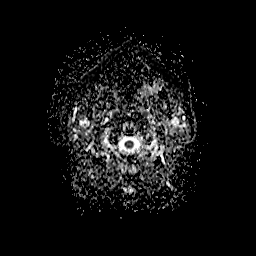
[im 11/44]
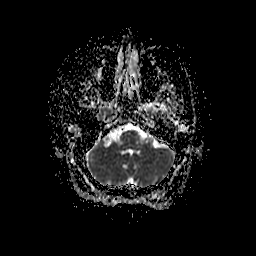
[im 22/44]
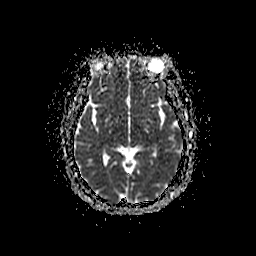
[im 33/44]
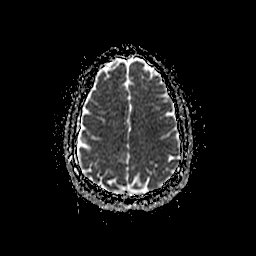
[im 44/44]
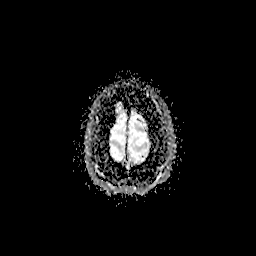

[Series 400: DWI · coronal · 5.0mm · 1.09mm/px · 4 of 35 slices shown (4 of 4)]
[im 1/35]
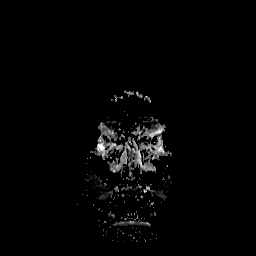
[im 12/35]
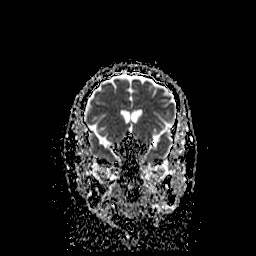
[im 23/35]
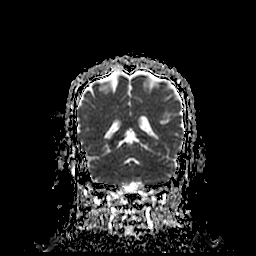
[im 35/35]
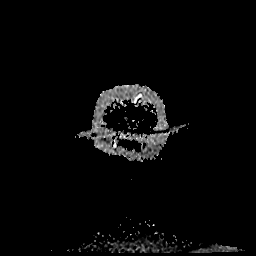

[36 of 48 positions shown; findings below may reference images not displayed]

FINDINGS: Brain: The diffusion-weighted images demonstrate no acute or
subacute infarction. No acute infarct, hemorrhage, or mass lesion is
present. No significant white matter disease is present. The
internal auditory canals are within normal limits bilaterally. The
brainstem and cerebellum are normal. The ventricles are of normal
size. No significant extraaxial fluid collection is present.

Vascular: Flow is present in the major intracranial arteries.

Skull and upper cervical spine: Skullbase is within normal limits.
The craniocervical junction is within normal limits. Midline
sagittal structures are unremarkable.

Sinuses/Orbits: Mild mucosal thickening is present in the maxillary
sinuses bilaterally, right greater than left. Mucosal thickening in
the ethmoid air cells is more prominent on the left. Frontal sinuses
and sphenoid sinuses are clear. The mastoid air cells are clear. The
globes and orbits are within normal limits.
IMPRESSION: 1. Normal MRI appearance of the brain. No evidence for acute or
subacute infarction.
2. Mild sinus disease.

## 2020-10-06 ENCOUNTER — Encounter (HOSPITAL_BASED_OUTPATIENT_CLINIC_OR_DEPARTMENT_OTHER): Payer: Self-pay

## 2020-10-06 ENCOUNTER — Other Ambulatory Visit (HOSPITAL_BASED_OUTPATIENT_CLINIC_OR_DEPARTMENT_OTHER): Payer: Self-pay | Admitting: Family Medicine

## 2020-10-06 ENCOUNTER — Emergency Department (HOSPITAL_BASED_OUTPATIENT_CLINIC_OR_DEPARTMENT_OTHER)
Admission: EM | Admit: 2020-10-06 | Discharge: 2020-10-06 | Disposition: A | Discharge status: Home / Self Care | Payer: Managed Care, Other (non HMO) | Attending: Emergency Medicine | Admitting: Emergency Medicine

## 2020-10-06 ENCOUNTER — Other Ambulatory Visit: Payer: Self-pay

## 2020-10-06 DIAGNOSIS — M5441 Lumbago with sciatica, right side: Secondary | ICD-10-CM | POA: Insufficient documentation

## 2020-10-06 DIAGNOSIS — I10 Essential (primary) hypertension: Secondary | ICD-10-CM | POA: Insufficient documentation

## 2020-10-06 DIAGNOSIS — Z7982 Long term (current) use of aspirin: Secondary | ICD-10-CM | POA: Insufficient documentation

## 2020-10-06 DIAGNOSIS — G8929 Other chronic pain: Secondary | ICD-10-CM

## 2020-10-06 DIAGNOSIS — Z79899 Other long term (current) drug therapy: Secondary | ICD-10-CM | POA: Insufficient documentation

## 2020-10-06 LAB — URINALYSIS, ROUTINE W REFLEX MICROSCOPIC
Bilirubin Urine: NEGATIVE
Glucose, UA: NEGATIVE mg/dL
Hgb urine dipstick: NEGATIVE
Ketones, ur: NEGATIVE mg/dL
Leukocytes,Ua: NEGATIVE
Nitrite: NEGATIVE
Protein, ur: NEGATIVE mg/dL
Specific Gravity, Urine: 1.005 (ref 1.005–1.030)
pH: 6.5 (ref 5.0–8.0)

## 2020-10-06 LAB — CBC WITH DIFFERENTIAL/PLATELET
Abs Immature Granulocytes: 0.02 10*3/uL (ref 0.00–0.07)
Basophils Absolute: 0 10*3/uL (ref 0.0–0.1)
Basophils Relative: 1 %
Eosinophils Absolute: 0.1 10*3/uL (ref 0.0–0.5)
Eosinophils Relative: 1 %
HCT: 35.8 % — ABNORMAL LOW (ref 36.0–46.0)
Hemoglobin: 11.5 g/dL — ABNORMAL LOW (ref 12.0–15.0)
Immature Granulocytes: 0 %
Lymphocytes Relative: 27 %
Lymphs Abs: 1.9 10*3/uL (ref 0.7–4.0)
MCH: 26.3 pg (ref 26.0–34.0)
MCHC: 32.1 g/dL (ref 30.0–36.0)
MCV: 81.7 fL (ref 80.0–100.0)
Monocytes Absolute: 0.5 10*3/uL (ref 0.1–1.0)
Monocytes Relative: 7 %
Neutro Abs: 4.5 10*3/uL (ref 1.7–7.7)
Neutrophils Relative %: 64 %
Platelets: 314 10*3/uL (ref 150–400)
RBC: 4.38 MIL/uL (ref 3.87–5.11)
RDW: 15.4 % (ref 11.5–15.5)
WBC: 7 10*3/uL (ref 4.0–10.5)
nRBC: 0 % (ref 0.0–0.2)

## 2020-10-06 LAB — COMPREHENSIVE METABOLIC PANEL
ALT: 12 U/L (ref 0–44)
AST: 14 U/L — ABNORMAL LOW (ref 15–41)
Albumin: 3.7 g/dL (ref 3.5–5.0)
Alkaline Phosphatase: 62 U/L (ref 38–126)
Anion gap: 10 (ref 5–15)
BUN: 9 mg/dL (ref 6–20)
CO2: 25 mmol/L (ref 22–32)
Calcium: 9.2 mg/dL (ref 8.9–10.3)
Chloride: 103 mmol/L (ref 98–111)
Creatinine, Ser: 0.68 mg/dL (ref 0.44–1.00)
GFR, Estimated: >60 mL/min (ref >60)
Glucose, Bld: 99 mg/dL (ref 70–99)
Potassium: 3.9 mmol/L (ref 3.5–5.1)
Sodium: 138 mmol/L (ref 135–145)
Total Bilirubin: <0.1 mg/dL — ABNORMAL LOW (ref 0.3–1.2)
Total Protein: 8.3 g/dL — ABNORMAL HIGH (ref 6.5–8.1)

## 2020-10-06 MED ORDER — LISINOPRIL 20 MG PO TABS: 20.0000 mg | ORAL_TABLET | Freq: Every day | ORAL | 0 refills | Status: DC

## 2020-10-06 MED ORDER — KETOROLAC TROMETHAMINE 30 MG/ML IJ SOLN
60.0000 mg | Freq: Once | INTRAMUSCULAR | Status: AC
  Administered 2020-10-06: 60 mg via INTRAMUSCULAR
  Filled 2020-10-06: qty 2

## 2020-10-06 MED FILL — LISINOPRIL 20 MG TABS: 20 | 30 days supply | Qty: 30 | Fill #0

## 2020-10-06 NOTE — ED Triage Notes (Signed)
Pt arrives ambulatory to ED with c/o pain in lower abdomen and lower back X2 weeks states that she has been using heat and ice with no relief. Pt also reports being out of her Lisinopril since June 2021.

## 2020-10-06 NOTE — Discharge Instructions (Addendum)
You can call any of the above numbers to see if you can establish care there.    You can take tylenol 1000mg  three times a day.  You can take ibuprofen 600mg  three tiems a day.   You can take both on the same day.    Your lab work was normal. Your groin and back pain are likely both caused by a bulging disc in your lower back that is compressing a nerve.  Establishing care witgh a primary care doctor can help you treat these chronic issues.

## 2020-10-06 NOTE — ED Notes (Signed)
ED Provider at bedside. 
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
## 2020-10-30 DIAGNOSIS — Z9851 Tubal ligation status: Secondary | ICD-10-CM | POA: Insufficient documentation

## 2020-10-30 DIAGNOSIS — D259 Leiomyoma of uterus, unspecified: Secondary | ICD-10-CM

## 2020-10-30 HISTORY — DX: Leiomyoma of uterus, unspecified: D25.9

## 2020-11-16 ENCOUNTER — Ambulatory Visit: Payer: Self-pay | Admitting: Nurse Practitioner

## 2020-12-22 ENCOUNTER — Ambulatory Visit: Payer: Self-pay | Admitting: Nurse Practitioner

## 2021-01-15 DIAGNOSIS — N920 Excessive and frequent menstruation with regular cycle: Secondary | ICD-10-CM | POA: Diagnosis not present

## 2021-01-15 DIAGNOSIS — Z9851 Tubal ligation status: Secondary | ICD-10-CM | POA: Diagnosis not present

## 2021-01-15 DIAGNOSIS — Z01419 Encounter for gynecological examination (general) (routine) without abnormal findings: Secondary | ICD-10-CM | POA: Diagnosis not present

## 2021-01-15 DIAGNOSIS — D259 Leiomyoma of uterus, unspecified: Secondary | ICD-10-CM | POA: Diagnosis not present

## 2021-01-15 LAB — HM PAP SMEAR: HM Pap smear: NEGATIVE

## 2021-01-22 DIAGNOSIS — Z3202 Encounter for pregnancy test, result negative: Secondary | ICD-10-CM | POA: Diagnosis not present

## 2021-01-22 DIAGNOSIS — N840 Polyp of corpus uteri: Secondary | ICD-10-CM | POA: Diagnosis not present

## 2021-01-22 DIAGNOSIS — N841 Polyp of cervix uteri: Secondary | ICD-10-CM | POA: Diagnosis not present

## 2021-01-29 ENCOUNTER — Ambulatory Visit: Payer: Self-pay | Admitting: Nurse Practitioner

## 2021-02-01 ENCOUNTER — Ambulatory Visit: Payer: BC Managed Care – PPO | Admitting: Nurse Practitioner

## 2021-02-01 ENCOUNTER — Encounter: Payer: Self-pay | Admitting: Nurse Practitioner

## 2021-02-01 ENCOUNTER — Other Ambulatory Visit: Payer: Self-pay

## 2021-02-01 VITALS — BP 138/92 | HR 76 | Temp 97.4°F | Ht 59.0 in | Wt 202.6 lb

## 2021-02-01 DIAGNOSIS — Z1211 Encounter for screening for malignant neoplasm of colon: Secondary | ICD-10-CM | POA: Diagnosis not present

## 2021-02-01 DIAGNOSIS — H9191 Unspecified hearing loss, right ear: Secondary | ICD-10-CM | POA: Insufficient documentation

## 2021-02-01 DIAGNOSIS — I10 Essential (primary) hypertension: Secondary | ICD-10-CM

## 2021-02-01 DIAGNOSIS — Z13220 Encounter for screening for lipoid disorders: Secondary | ICD-10-CM

## 2021-02-01 DIAGNOSIS — H02401 Unspecified ptosis of right eyelid: Secondary | ICD-10-CM | POA: Insufficient documentation

## 2021-02-01 DIAGNOSIS — Z0000 Encounter for general adult medical examination without abnormal findings: Secondary | ICD-10-CM

## 2021-02-01 DIAGNOSIS — Z136 Encounter for screening for cardiovascular disorders: Secondary | ICD-10-CM | POA: Diagnosis not present

## 2021-02-01 LAB — CBC WITH DIFFERENTIAL/PLATELET
Basophils Absolute: 0.1 10*3/uL (ref 0.0–0.1)
Basophils Relative: 1.4 % (ref 0.0–3.0)
Eosinophils Absolute: 0.1 10*3/uL (ref 0.0–0.7)
Eosinophils Relative: 1.1 % (ref 0.0–5.0)
HCT: 36.1 % (ref 36.0–46.0)
Hemoglobin: 11.7 g/dL — ABNORMAL LOW (ref 12.0–15.0)
Lymphocytes Relative: 29.5 % (ref 12.0–46.0)
Lymphs Abs: 2.3 10*3/uL (ref 0.7–4.0)
MCHC: 32.4 g/dL (ref 30.0–36.0)
MCV: 81.1 fl (ref 78.0–100.0)
Monocytes Absolute: 0.5 10*3/uL (ref 0.1–1.0)
Monocytes Relative: 6.3 % (ref 3.0–12.0)
Neutro Abs: 4.8 10*3/uL (ref 1.4–7.7)
Neutrophils Relative %: 61.7 % (ref 43.0–77.0)
Platelets: 322 10*3/uL (ref 150.0–400.0)
RBC: 4.46 Mil/uL (ref 3.87–5.11)
RDW: 15.4 % (ref 11.5–15.5)
WBC: 7.7 10*3/uL (ref 4.0–10.5)

## 2021-02-01 LAB — TSH: TSH: 1.72 u[IU]/mL (ref 0.35–4.50)

## 2021-02-01 MED ORDER — LISINOPRIL 20 MG PO TABS
20.0000 mg | ORAL_TABLET | Freq: Every day | ORAL | 3 refills | Status: DC
Start: 2021-02-01 — End: 2021-07-28
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
## 2021-02-01 NOTE — Assessment & Plan Note (Signed)
Improving with lisinopril BP Readings from Last 3 Encounters:  02/01/21 (!) 138/92  10/06/20 (!) 176/114  07/28/17 140/87   Advised about need for DASH diet, exercise and tobacco cessation Check CMP, cbc, tsh Refill lisinopril

## 2021-02-01 NOTE — Patient Instructions (Addendum)
Sign medical release to get mammogram and PAP results from GYN  Continue lisinopril for HTN Maintain DASH diet  Go to lab for blood draw  You will be contacted to schedule appt with GI for colonoscopy.  Thank you for choosing [facility].  Preventive Care 39-45 Years Old, Female Preventive care refers to lifestyle choices and visits with your health care provider that can promote health and wellness. This includes:  A yearly physical exam. This is also called an annual wellness visit.  Regular dental and eye exams.  Immunizations.  Screening for certain conditions.  Healthy lifestyle choices, such as: ? Eating a healthy diet. ? Getting regular exercise. ? Not using drugs or products that contain nicotine and tobacco. ? Limiting alcohol use. What can I expect for my preventive care visit? Physical exam Your health care provider will check your:  Height and weight. These may be used to calculate your BMI (body mass index). BMI is a measurement that tells if you are at a healthy weight.  Heart rate and blood pressure.  Body temperature.  Skin for abnormal spots. Counseling Your health care provider may ask you questions about your:  Past medical problems.  Family's medical history.  Alcohol, tobacco, and drug use.  Emotional well-being.  Home life and relationship well-being.  Sexual activity.  Diet, exercise, and sleep habits.  Work and work Statistician.  Access to firearms.  Method of birth control.  Menstrual cycle.  Pregnancy history. What immunizations do I need? Vaccines are usually given at various ages, according to a schedule. Your health care provider will recommend vaccines for you based on your age, medical history, and lifestyle or other factors, such as travel or where you work.   What tests do I need? Blood tests  Lipid and cholesterol levels. These may be checked every 5 years, or more often if you are over 39 years  old.  Hepatitis C test.  Hepatitis B test. Screening  Lung cancer screening. You may have this screening every year starting at age 85 if you have a 30-pack-year history of smoking and currently smoke or have quit within the past 15 years.  Colorectal cancer screening. ? All adults should have this screening starting at age 69 and continuing until age 80. ? Your health care provider may recommend screening at age 9 if you are at increased risk. ? You will have tests every 1-10 years, depending on your results and the type of screening test.  Diabetes screening. ? This is done by checking your blood sugar (glucose) after you have not eaten for a while (fasting). ? You may have this done every 1-3 years.  Mammogram. ? This may be done every 1-2 years. ? Talk with your health care provider about when you should start having regular mammograms. This may depend on whether you have a family history of breast cancer.  BRCA-related cancer screening. This may be done if you have a family history of breast, ovarian, tubal, or peritoneal cancers.  Pelvic exam and Pap test. ? This may be done every 3 years starting at age 109. ? Starting at age 67, this may be done every 5 years if you have a Pap test in combination with an HPV test. Other tests  STD (sexually transmitted disease) testing, if you are at risk.  Bone density scan. This is done to screen for osteoporosis. You may have this scan if you are at high risk for osteoporosis. Talk with your health care  provider about your test results, treatment options, and if necessary, the need for more tests. Follow these instructions at home: Eating and drinking  Eat a diet that includes fresh fruits and vegetables, whole grains, lean protein, and low-fat dairy products.  Take vitamin and mineral supplements as recommended by your health care provider.  Do not drink alcohol if: ? Your health care provider tells you not to drink. ? You are  pregnant, may be pregnant, or are planning to become pregnant.  If you drink alcohol: ? Limit how much you have to 0-1 drink a day. ? Be aware of how much alcohol is in your drink. In the [place] one drink equals one 12 oz bottle of beer (355 mL), one 5 oz glass of wine (148 mL), or one 1 oz glass of hard liquor (44 mL).   Lifestyle  Take daily care of your teeth and gums. Brush your teeth every morning and night with fluoride toothpaste. Floss one time each day.  Stay active. Exercise for at least 30 minutes 5 or more days each week.  Do not use any products that contain nicotine or tobacco, such as cigarettes, e-cigarettes, and chewing tobacco. If you need help quitting, ask your health care provider.  Do not use drugs.  If you are sexually active, practice safe sex. Use a condom or other form of protection to prevent STIs (sexually transmitted infections).  If you do not wish to become pregnant, use a form of birth control. If you plan to become pregnant, see your health care provider for a prepregnancy visit.  If told by your health care provider, take low-dose aspirin daily starting at age 76.  Find healthy ways to cope with stress, such as: ? Meditation, yoga, or listening to music. ? Journaling. ? Talking to a trusted person. ? Spending time with friends and family. Safety  Always wear your seat belt while driving or riding in a vehicle.  Do not drive: ? If you have been drinking alcohol. Do not ride with someone who has been drinking. ? When you are tired or distracted. ? While texting.  Wear a helmet and other protective equipment during sports activities.  If you have firearms in your house, make sure you follow all gun safety procedures. What's next?  Visit your health care provider once a year for an annual wellness visit.  Ask your health care provider how often you should have your eyes and teeth checked.  Stay up to date on all vaccines. This information is  not intended to replace advice given to you by your health care provider. Make sure you discuss any questions you have with your health care provider. Document Revised: 05/19/2020 Document Reviewed: 04/26/2018 Elsevier Patient Education  2021 [employer].
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
## 2021-02-01 NOTE — Assessment & Plan Note (Signed)
secondary to bells palsy

## 2021-02-02 ENCOUNTER — Encounter: Payer: Self-pay | Admitting: Nurse Practitioner

## 2021-02-02 ENCOUNTER — Telehealth: Payer: Self-pay

## 2021-02-02 DIAGNOSIS — N6310 Unspecified lump in the right breast, unspecified quadrant: Secondary | ICD-10-CM

## 2021-02-02 LAB — LIPID PANEL
Cholesterol: 152 mg/dL (ref 0–200)
HDL: 43.3 mg/dL (ref >39.00)
LDL Cholesterol: 94 mg/dL (ref 0–99)
NonHDL: 108.8
Total CHOL/HDL Ratio: 4
Triglycerides: 75 mg/dL (ref 0.0–149.0)
VLDL: 15 mg/dL (ref 0.0–40.0)

## 2021-02-02 LAB — COMPREHENSIVE METABOLIC PANEL
ALT: 11 U/L (ref 0–35)
AST: 13 U/L (ref 0–37)
Albumin: 4 g/dL (ref 3.5–5.2)
Alkaline Phosphatase: 65 U/L (ref 39–117)
BUN: 11 mg/dL (ref 6–23)
CO2: 22 mEq/L (ref 19–32)
Calcium: 9.2 mg/dL (ref 8.4–10.5)
Chloride: 103 mEq/L (ref 96–112)
Creatinine, Ser: 0.72 mg/dL (ref 0.40–1.20)
GFR: 100.78 mL/min (ref >60.00)
Glucose, Bld: 78 mg/dL (ref 70–99)
Potassium: 4 mEq/L (ref 3.5–5.1)
Sodium: 137 mEq/L (ref 135–145)
Total Bilirubin: 0.2 mg/dL (ref 0.2–1.2)
Total Protein: 7.7 g/dL (ref 6.0–8.3)

## 2021-02-02 NOTE — Addendum Note (Signed)
Addended by: Leana Gamer on: 02/02/2021 04:41 PM   Modules accepted: Orders

## 2021-02-02 NOTE — Telephone Encounter (Signed)
Spoke to patient and she never had the f/u recommendations done due to losing her insurance then but would like to get this done now.    Please review and advise.  Thanks.  Dm/cma

## 2021-02-02 NOTE — Telephone Encounter (Signed)
lft VM to rtn call to check on if the f/u recommendations were done from last mammogram (Breast US). Dm/cma

## 2021-02-08 DIAGNOSIS — I10 Essential (primary) hypertension: Secondary | ICD-10-CM | POA: Diagnosis not present

## 2021-02-08 DIAGNOSIS — Z0001 Encounter for general adult medical examination with abnormal findings: Secondary | ICD-10-CM | POA: Diagnosis not present

## 2021-02-08 DIAGNOSIS — N939 Abnormal uterine and vaginal bleeding, unspecified: Secondary | ICD-10-CM | POA: Diagnosis not present

## 2021-02-08 DIAGNOSIS — Z3202 Encounter for pregnancy test, result negative: Secondary | ICD-10-CM | POA: Diagnosis not present

## 2021-02-08 DIAGNOSIS — Z79899 Other long term (current) drug therapy: Secondary | ICD-10-CM | POA: Diagnosis not present

## 2021-02-08 DIAGNOSIS — N840 Polyp of corpus uteri: Secondary | ICD-10-CM | POA: Diagnosis not present

## 2021-02-12 ENCOUNTER — Telehealth: Payer: Self-pay | Admitting: Nurse Practitioner

## 2021-02-12 DIAGNOSIS — N6310 Unspecified lump in the right breast, unspecified quadrant: Secondary | ICD-10-CM

## 2021-02-12 NOTE — Telephone Encounter (Signed)
Can you please correct mammogram order from 02/02/21 when you have a moment, it was put in for ms and not mm

## 2021-02-15 ENCOUNTER — Encounter: Payer: Self-pay | Admitting: Physician Assistant

## 2021-02-17 ENCOUNTER — Other Ambulatory Visit: Payer: Self-pay | Admitting: Nurse Practitioner

## 2021-02-17 DIAGNOSIS — Z1231 Encounter for screening mammogram for malignant neoplasm of breast: Secondary | ICD-10-CM

## 2021-02-20 ENCOUNTER — Ambulatory Visit
Admission: RE | Admit: 2021-02-20 | Discharge: 2021-02-20 | Disposition: A | Discharge status: Home / Self Care | Payer: BC Managed Care – PPO | Source: Ambulatory Visit | Attending: Nurse Practitioner | Admitting: Nurse Practitioner

## 2021-02-20 ENCOUNTER — Other Ambulatory Visit: Payer: Self-pay

## 2021-02-20 DIAGNOSIS — Z1231 Encounter for screening mammogram for malignant neoplasm of breast: Secondary | ICD-10-CM

## 2021-03-17 ENCOUNTER — Ambulatory Visit (INDEPENDENT_AMBULATORY_CARE_PROVIDER_SITE_OTHER): Payer: BC Managed Care – PPO | Admitting: Physician Assistant

## 2021-03-17 ENCOUNTER — Encounter: Payer: Self-pay | Admitting: Physician Assistant

## 2021-03-17 DIAGNOSIS — Z1211 Encounter for screening for malignant neoplasm of colon: Secondary | ICD-10-CM | POA: Diagnosis not present

## 2021-03-17 MED ORDER — PLENVU 140 G PO SOLR: 1.0000 | ORAL | 0 refills | Status: DC

## 2021-03-17 NOTE — Patient Instructions (Signed)
If you are age 46 or younger, your body mass index should be between 19-25. Your There is no height or weight on file to calculate BMI. If this is out of the aformentioned range listed, please consider follow up with your Primary Care Provider.  __________________________________________________________  The [place] [facility] providers would like to encourage you to use [facility] to communicate with providers for non-urgent requests or questions.  Due to long hold times on the telephone, sending your provider a message by [facility] may be a faster and more efficient way to get a response.  Please allow 48 business hours for a response.  Please remember that this is for non-urgent requests.   You have been scheduled for a colonoscopy. Please follow written instructions given to you at your visit today.  Please pick up your prep supplies at the pharmacy within the next 1-3 days. If you use inhalers (even only as needed), please bring them with you on the day of your procedure.  Thank you for entrusting me with your care and choosing [facility].  Amy Esterwood, PA-C

## 2021-03-17 NOTE — Progress Notes (Signed)
Subjective:    Patient ID: [name], female    DOB: 1976/03/28, 45 y.o.   MRN: [id number]  HPI [name] is a pleasant 45 year old African-American female, new to GI today referred by Debra Lacy NP for screening colonoscopy.  She was brought in for office visit because she mentioned she had had a prior colonoscopy. By patient report it sounds as if she had 1 prior colonoscopy and EGD done greater than 10 years ago in [name].  She does not know the physician's name or practice name where she had the procedures She does not recall being told that she had any polyps or other findings on colonoscopy. Family history is negative for colon cancer Patient has no current complaints of abdominal pain, changes in bowel habits or rectal bleeding. She will occasionally have some acid reflux, diet related and treated with Tums or Rolaids, denies any dysphagia. She has history of hypertension, prior TIA, anxiety, she is status post bilateral tubal ligation and had laparoscopic cholecystectomy in 2017. She is maintained on baby aspirin once daily, no blood thinners.  Review of Systems  Pertinent positive and negative review of systems were noted in the above HPI section.  All other review of systems was otherwise negative.   Outpatient Encounter Medications as of 03/17/2021  Medication Sig   aspirin 81 MG EC tablet Take 81 mg by mouth daily.   gabapentin (NEURONTIN) 300 MG capsule Take 600 mg by mouth daily.   lisinopril (ZESTRIL) 20 MG tablet Take 1 tablet (20 mg total) by mouth daily.   medroxyPROGESTERone (PROVERA) 5 MG tablet Take 5 mg by mouth daily.   PEG-KCl-NaCl-NaSulf-Na Asc-C (PLENVU) 140 g SOLR Take 1 kit by mouth as directed. Manufacturer's coupon Universal coupon code:BIN: P2366821; GROUP: [id number]; PCN: [id number]; ID: [id number]; PAY NO MORE $50; NO prior authorization   [DISCONTINUED] norethindrone (AYGESTIN) 5 MG tablet Take 5 mg by mouth daily.    [DISCONTINUED] ondansetron (ZOFRAN) 4 MG  tablet Take 4 mg by mouth every 6 (six) hours.   No facility-administered encounter medications on file as of 03/17/2021.   Allergies  Allergen Reactions   Erythromycin Rash   Patient Active Problem List   Diagnosis Date Noted   Ptosis of eyelid, right 02/01/2021   Hearing loss in right ear 02/01/2021   H/O tubal ligation 10/30/2020   Uterine fibroid 10/30/2020   Post-traumatic arthritis of ankle, left 10/12/2018   Closed displaced pilon fracture of left tibia 11/10/2017   TIA (transient ischemic attack)    HTN (hypertension) 07/02/2017   Anxiety 07/02/2017   Social History   Socioeconomic History   Marital status: Married    Spouse name: Not on file   Number of children: 3   Years of education: Not on file   Highest education level: Not on file  Occupational History   Occupation: labeler  Tobacco Use   Smoking status: Light Smoker    Types: Cigarettes   Smokeless tobacco: Never   Tobacco comments:    Only smokes once a month or two  Vaping Use   Vaping Use: Never used  Substance and Sexual Activity   Alcohol use: Yes    Comment: rare   Drug use: No   Sexual activity: Not Currently    Birth control/protection: Surgical, Pill  Other Topics Concern   Not on file  Social History Narrative   Not on file   Social Determinants of Health   Financial Resource Strain: Not on file  Food Insecurity: Not  on file  Transportation Needs: Not on file  Physical Activity: Not on file  Stress: Not on file  Social Connections: Not on file  Intimate Partner Violence: Not on file    [name]'s family history includes Diabetes in her mother.      Objective:    There were no vitals filed for this visit.  Physical Exam Well-developed well-nourished African-American female in no acute distress.   Weight, 202 BMI  HEENT; nontraumatic normocephalic, EOMI, PE R LA, sclera anicteric. Oropharynx; not examined today Neck; supple, no JVD Cardiovascular; regular rate and  rhythm with S1-S2, no murmur rub or gallop Pulmonary; Clear bilaterally Abdomen; soft, nontender, nondistended, no palpable mass or hepatosplenomegaly, bowel sounds are active, periumbilical scar secondary to BTL and lap Rectal; not done today Skin; benign exam, no jaundice rash or appreciable lesions Extremities; no clubbing cyanosis or edema skin warm and dry Neuro/Psych; alert and oriented x4, grossly nonfocal mood and affect appropriate        Assessment & Plan:   #52 45 year old African-American female referred for screening colonoscopy, asymptomatic and average risk. Reported very remote colonoscopy greater than 10 years ago, practice and/or physician not [name]  #2 history of prior TIA/ baby ASA daily #3.  Hypertension #4.  Status post laparoscopic cholecystectomy and bilateral tubal ligation  Plan; Patient will be scheduled for Colonoscopy with Dr. Tarri Potter.  Procedure was discussed in detail with the patient including indications risks and benefits and she is agreeable to proceed   Debra Ferguson PA-C 03/17/2021   Cc: Debra Buffy, NP

## 2021-03-19 NOTE — Progress Notes (Signed)
Reviewed and agree with management plans. ? ?Leodan Bolyard L. Zarius Furr, MD, MPH  ?

## 2021-04-06 DIAGNOSIS — N814 Uterovaginal prolapse, unspecified: Secondary | ICD-10-CM | POA: Diagnosis not present

## 2021-05-04 ENCOUNTER — Ambulatory Visit: Payer: BC Managed Care – PPO | Admitting: Nurse Practitioner

## 2021-05-31 ENCOUNTER — Encounter: Payer: BC Managed Care – PPO | Admitting: Gastroenterology

## 2021-06-02 ENCOUNTER — Telehealth: Payer: Self-pay | Admitting: Gastroenterology

## 2021-06-02 NOTE — Telephone Encounter (Signed)
Faxed Co-pay card to patient's pharmacy.

## 2021-06-02 NOTE — Telephone Encounter (Signed)
Pt called asking if there was an alternative medication for her colon prep that was less inexpensive?  Please call and advise.  Thank you.

## 2021-06-03 NOTE — Telephone Encounter (Signed)
Followed up with [facility].  They are running the coupon code for the patient.

## 2021-06-10 ENCOUNTER — Telehealth: Payer: Self-pay | Admitting: Gastroenterology

## 2021-06-10 NOTE — Telephone Encounter (Signed)
Good afternoon Dr. Tarri Glenn.Debra KitchenMarland KitchenThis patient had a procedure scheduled for tomorrow but stated she was exposed to Covid and has not received her results as of yet.  It was her preference to just reschedule for now.  Patient has rescheduled for 12/1 at 2:00 p.m.  Thank you.

## 2021-06-10 NOTE — Telephone Encounter (Signed)
Pt called today and rescheduled her colonoscopy for 12/1 at 2:00 p.m.  She is requesting new instructions be sent to her as she is now being seen in the afternoon instead of the morning.  Thank you.

## 2021-06-11 ENCOUNTER — Encounter: Payer: BC Managed Care – PPO | Admitting: Gastroenterology

## 2021-06-11 NOTE — Telephone Encounter (Signed)
Noted. Thanks.

## 2021-06-14 NOTE — Telephone Encounter (Signed)
New instructions have been typed up the patient and a copy has been placed in out going mail. Left voicemail to inform patient.

## 2021-07-28 ENCOUNTER — Other Ambulatory Visit: Payer: Self-pay

## 2021-07-28 ENCOUNTER — Ambulatory Visit: Payer: BC Managed Care – PPO | Admitting: Nurse Practitioner

## 2021-07-28 ENCOUNTER — Encounter: Payer: Self-pay | Admitting: Nurse Practitioner

## 2021-07-28 VITALS — BP 158/98 | HR 84 | Temp 96.5°F | Ht 59.0 in | Wt 195.4 lb

## 2021-07-28 DIAGNOSIS — R739 Hyperglycemia, unspecified: Secondary | ICD-10-CM | POA: Diagnosis not present

## 2021-07-28 DIAGNOSIS — I10 Essential (primary) hypertension: Secondary | ICD-10-CM

## 2021-07-28 LAB — BASIC METABOLIC PANEL
BUN: 8 mg/dL (ref 6–23)
CO2: 25 mEq/L (ref 19–32)
Calcium: 9.2 mg/dL (ref 8.4–10.5)
Chloride: 106 mEq/L (ref 96–112)
Creatinine, Ser: 0.72 mg/dL (ref 0.40–1.20)
GFR: 100.44 mL/min (ref >60.00)
Glucose, Bld: 81 mg/dL (ref 70–99)
Potassium: 3.8 mEq/L (ref 3.5–5.1)
Sodium: 139 mEq/L (ref 135–145)

## 2021-07-28 LAB — HEMOGLOBIN A1C: Hgb A1c MFr Bld: 5.7 % (ref 4.6–6.5)

## 2021-07-28 MED ORDER — AMLODIPINE BESYLATE 5 MG PO TABS: 5.0000 mg | ORAL_TABLET | Freq: Every day | ORAL | 1 refills | Status: DC

## 2021-07-28 MED ORDER — LISINOPRIL 20 MG PO TABS: 20.0000 mg | ORAL_TABLET | Freq: Every day | ORAL | 3 refills | Status: DC

## 2021-07-28 NOTE — Patient Instructions (Signed)
Maintain lisinopril dose Add amlodipine 5mg  at bedtime. Maintain DASH diet Continue to monitor BP in Am 3x/week Go to lab for blood draw.  DASH Eating Plan DASH stands for Dietary Approaches to Stop Hypertension. The DASH eating plan is a healthy eating plan that has been shown to: Reduce high blood pressure (hypertension). Reduce your risk for type 2 diabetes, heart disease, and stroke. Help with weight loss. What are tips for following this plan? Reading food labels Check food labels for the amount of salt (sodium) per serving. Choose foods with less than 5 percent of the Daily Value of sodium. Generally, foods with less than 300 milligrams (mg) of sodium per serving fit into this eating plan. To find whole grains, look for the word "whole" as the first word in the ingredient list. Shopping Buy products labeled as "low-sodium" or "no salt added." Buy fresh foods. Avoid canned foods and pre-made or frozen meals. Cooking Avoid adding salt when cooking. Use salt-free seasonings or herbs instead of table salt or sea salt. Check with your health care provider or pharmacist before using salt substitutes. Do not fry foods. Cook foods using healthy methods such as baking, boiling, grilling, roasting, and broiling instead. Cook with heart-healthy oils, such as olive, canola, avocado, soybean, or sunflower oil. Meal planning  Eat a balanced diet that includes: 4 or more servings of fruits and 4 or more servings of vegetables each day. Try to fill one-half of your plate with fruits and vegetables. 6-8 servings of whole grains each day. Less than 6 oz (170 g) of lean meat, poultry, or fish each day. A 3-oz (85-g) serving of meat is about the same size as a deck of cards. One egg equals 1 oz (28 g). 2-3 servings of low-fat dairy each day. One serving is 1 cup (237 mL). 1 serving of nuts, seeds, or beans 5 times each week. 2-3 servings of heart-healthy fats. Healthy fats called omega-3 fatty acids  are found in foods such as walnuts, flaxseeds, fortified milks, and eggs. These fats are also found in cold-water fish, such as sardines, salmon, and mackerel. Limit how much you eat of: Canned or prepackaged foods. Food that is high in trans fat, such as some fried foods. Food that is high in saturated fat, such as fatty meat. Desserts and other sweets, sugary drinks, and other foods with added sugar. Full-fat dairy products. Do not salt foods before eating. Do not eat more than 4 egg yolks a week. Try to eat at least 2 vegetarian meals a week. Eat more home-cooked food and less restaurant, buffet, and fast food. Lifestyle When eating at a restaurant, ask that your food be prepared with less salt or no salt, if possible. If you drink alcohol: Limit how much you use to: 0-1 drink a day for women who are not pregnant. 0-2 drinks a day for men. Be aware of how much alcohol is in your drink. In the [place] one drink equals one 12 oz bottle of beer (355 mL), one 5 oz glass of wine (148 mL), or one 1 oz glass of hard liquor (44 mL). General information Avoid eating more than 2,300 mg of salt a day. If you have hypertension, you may need to reduce your sodium intake to 1,500 mg a day. Work with your health care provider to maintain a healthy body weight or to lose weight. Ask what an ideal weight is for you. Get at least 30 minutes of exercise that causes your heart to  beat faster (aerobic exercise) most days of the week. Activities may include walking, swimming, or biking. Work with your health care provider or dietitian to adjust your eating plan to your individual calorie needs. What foods should I eat? Fruits All fresh, dried, or frozen fruit. Canned fruit in natural juice (without added sugar). Vegetables Fresh or frozen vegetables (raw, steamed, roasted, or grilled). Low-sodium or reduced-sodium tomato and vegetable juice. Low-sodium or reduced-sodium tomato sauce and tomato paste.  Low-sodium or reduced-sodium canned vegetables. Grains Whole-grain or whole-wheat bread. Whole-grain or whole-wheat pasta. Brown rice. [place]. Bulgur. Whole-grain and low-sodium cereals. Pita bread. Low-fat, low-sodium crackers. Whole-wheat flour tortillas. Meats and other proteins Skinless chicken or [place]. Ground chicken or [place]. Pork with fat trimmed off. Fish and seafood. Egg whites. Dried beans, peas, or lentils. Unsalted nuts, nut butters, and seeds. Unsalted canned beans. Lean cuts of beef with fat trimmed off. Low-sodium, lean precooked or cured meat, such as sausages or meat loaves. Dairy Low-fat (1%) or fat-free (skim) milk. Reduced-fat, low-fat, or fat-free cheeses. Nonfat, low-sodium ricotta or cottage cheese. Low-fat or nonfat yogurt. Low-fat, low-sodium cheese. Fats and oils Soft margarine without trans fats. Vegetable oil. Reduced-fat, low-fat, or light mayonnaise and salad dressings (reduced-sodium). Canola, safflower, olive, avocado, soybean, and sunflower oils. Avocado. Seasonings and condiments Herbs. Spices. Seasoning mixes without salt. Other foods Unsalted popcorn and pretzels. Fat-free sweets. The items listed above may not be a complete list of foods and beverages you can eat. Contact a dietitian for more information. What foods should I avoid? Fruits Canned fruit in a light or heavy syrup. Fried fruit. Fruit in cream or butter sauce. Vegetables Creamed or fried vegetables. Vegetables in a cheese sauce. Regular canned vegetables (not low-sodium or reduced-sodium). Regular canned tomato sauce and paste (not low-sodium or reduced-sodium). Regular tomato and vegetable juice (not low-sodium or reduced-sodium). Angie Fava. Olives. Grains Baked goods made with fat, such as croissants, muffins, or some breads. Dry pasta or rice meal packs. Meats and other proteins Fatty cuts of meat. Ribs. Fried meat. Berniece Salines. Bologna, salami, and other precooked or cured meats, such as  sausages or meat loaves. Fat from the back of a pig (fatback). Bratwurst. Salted nuts and seeds. Canned beans with added salt. Canned or smoked fish. Whole eggs or egg yolks. Chicken or [place] with skin. Dairy Whole or 2% milk, cream, and half-and-half. Whole or full-fat cream cheese. Whole-fat or sweetened yogurt. Full-fat cheese. Nondairy creamers. Whipped toppings. Processed cheese and cheese spreads. Fats and oils Butter. Stick margarine. Lard. Shortening. Ghee. Bacon fat. Tropical oils, such as coconut, palm kernel, or palm oil. Seasonings and condiments Onion salt, garlic salt, seasoned salt, table salt, and sea salt. Worcestershire sauce. Tartar sauce. Barbecue sauce. Teriyaki sauce. Soy sauce, including reduced-sodium. Steak sauce. Canned and packaged gravies. Fish sauce. Oyster sauce. Cocktail sauce. Store-bought horseradish. Ketchup. Mustard. Meat flavorings and tenderizers. Bouillon cubes. Hot sauces. Pre-made or packaged marinades. Pre-made or packaged taco seasonings. Relishes. Regular salad dressings. Other foods Salted popcorn and pretzels. The items listed above may not be a complete list of foods and beverages you should avoid. Contact a dietitian for more information. Where to find more information National Heart, Lung, and Blood Institute: https://wilson-eaton.com/ American Heart Association: www.heart.org Academy of Nutrition and Dietetics: www.eatright.[place]: www.kidney.org Summary The DASH eating plan is a healthy eating plan that has been shown to reduce high blood pressure (hypertension). It may also reduce your risk for type 2 diabetes, heart disease, and stroke. When  on the DASH eating plan, aim to eat more fresh fruits and vegetables, whole grains, lean proteins, low-fat dairy, and heart-healthy fats. With the DASH eating plan, you should limit salt (sodium) intake to 2,300 mg a day. If you have hypertension, you may need to reduce your sodium intake to  1,500 mg a day. Work with your health care provider or dietitian to adjust your eating plan to your individual calorie needs. This information is not intended to replace advice given to you by your health care provider. Make sure you discuss any questions you have with your health care provider. Document Revised: 07/19/2019 Document Reviewed: 07/19/2019 Elsevier Patient Education  2022 [employer].

## 2021-07-28 NOTE — Assessment & Plan Note (Signed)
BP not at goal with lisinopril 20mg  Repeated BP in office: 160/100 Reports she is compliant with medication. Not complaint with diet. Unable to exercise to old ankle injury which limited ROM. 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 Printed information provided F/up 96month

## 2021-07-28 NOTE — Progress Notes (Signed)
Subjective:  Patient ID: [name], female    DOB: Oct 26, 1975  Age: 45 y.o. MRN: [id number]  CC: Follow-up (3 month f/u on HTN. Pt states she checks BP at home and they have still been slightly high. /Declines all vaccines today. )  HPI  HTN (hypertension) BP not at goal with lisinopril 90m Repeated BP in office: 160/100 Reports she is compliant with medication. Not complaint with diet. Unable to exercise to old ankle injury which limited ROM. 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 Printed information provided F/up 40m53montheviewed past Medical, Social and Family history today.  Outpatient Medications Prior to Visit  Medication Sig Dispense Refill   aspirin 81 MG EC tablet Take 81 mg by mouth daily.     gabapentin (NEURONTIN) 300 MG capsule Take 600 mg by mouth daily.     medroxyPROGESTERone (PROVERA) 5 MG tablet Take 5 mg by mouth daily.     PEG-KCl-NaCl-NaSulf-Na Asc-C (PLENVU) 140 g SOLR Take 1 kit by mouth as directed. Manufacturer's coupon Universal coupon code:BIN: [id number]: A: [id number]: CNRX; ID: 3: [id number] NO MORE $50; NO prior authorization 1 each 0   lisinopril (ZESTRIL) 20 MG tablet Take 1 tablet (20 mg total) by mouth daily. 90 tablet 3   No facility-administered medications prior to visit.    ROS See HPI  Objective:  BP (!) 158/98 (BP Location: Left Arm, Patient Position: Sitting, Cuff Size: Large)   Pulse 84   Temp (!) 96.5 F (35.8 C) (Temporal)   Ht 4' 11" (1.499 m)   Wt 195 lb 6.4 oz (88.6 kg)   SpO2 100%   BMI 39.47 kg/m   Physical Exam Constitutional:      Appearance: She is obese.  Cardiovascular:     Rate and Rhythm: Normal rate and regular rhythm.     Pulses: Normal pulses.     Heart sounds: Normal heart sounds.   Pulmonary:     Effort: Pulmonary effort is normal.     Breath sounds: Normal breath sounds.  Musculoskeletal:     Cervical back: Normal range of motion.     Right lower leg: No edema.     Left lower leg: No edema.  Neurological:     Mental Status: She is alert and oriented to person, place, and time.  Psychiatric:        Mood and Affect: Mood normal.        Behavior: Behavior normal.        Thought Content: Thought content normal.   Assessment & Plan:  This visit occurred during the SARS-CoV-2 public health emergency.  Safety protocols were in place, including screening questions prior to the visit, additional usage of staff PPE, and extensive cleaning of exam room while observing appropriate contact time as indicated for disinfecting solutions.   Svetlana was seen today for follow-up.  Diagnoses and all orders for this visit:  Primary hypertension -     lisinopril (ZESTRIL) 20 MG tablet; Take 1 tablet (20 mg total) by mouth daily. -     amLODipine (NORVASC) 5 MG tablet; Take 1 tablet (5 mg total) by mouth at bedtime. -     Basic metabolic panel  Hyperglycemia -     Hemoglobin A1c -     Basic metabolic  panel  Problem List Items Addressed This Visit       Cardiovascular and Mediastinum   HTN (hypertension) - Primary    BP not at goal with lisinopril 65m Repeated BP in office: 160/100 Reports she is compliant with medication. Not complaint with diet. Unable to exercise to old ankle injury which limited ROM. 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 Printed information provided F/up 21m42month   Relevant Medications   lisinopril (ZESTRIL) 20 MG tablet   amLODipine (NORVASC) 5 MG tablet   Other Relevant Orders   Basic metabolic panel     Other   Hyperglycemia    Relevant Orders   Hemoglobin A1cP3IBasic metabolic panel    Follow-up: Return in about 4 weeks (around 08/25/2021) for HTN.  ChaWilfred LacyP

## 2021-07-29 ENCOUNTER — Encounter: Payer: Self-pay | Admitting: Gastroenterology

## 2021-07-29 ENCOUNTER — Ambulatory Visit (AMBULATORY_SURGERY_CENTER): Payer: BC Managed Care – PPO | Admitting: Gastroenterology

## 2021-07-29 VITALS — BP 155/88 | HR 62 | Temp 98.7°F | Resp 12 | Ht 59.0 in | Wt 195.0 lb

## 2021-07-29 DIAGNOSIS — D128 Benign neoplasm of rectum: Secondary | ICD-10-CM

## 2021-07-29 DIAGNOSIS — K5140 Inflammatory polyps of colon without complications: Secondary | ICD-10-CM | POA: Diagnosis not present

## 2021-07-29 DIAGNOSIS — Z1211 Encounter for screening for malignant neoplasm of colon: Secondary | ICD-10-CM

## 2021-07-29 DIAGNOSIS — K621 Rectal polyp: Secondary | ICD-10-CM | POA: Diagnosis not present

## 2021-07-29 MED ORDER — SODIUM CHLORIDE 0.9 % IV SOLN: 500.0000 mL | Freq: Once | INTRAVENOUS | Status: DC

## 2021-07-29 NOTE — Progress Notes (Signed)
Pt's states no medical or surgical changes since previsit or office visit.   Vitals CW   Manual bp 150/100, CRNA notified
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
## 2021-07-29 NOTE — Progress Notes (Signed)
To PACU, VSS. Report to [email or web address] 

## 2021-07-29 NOTE — Patient Instructions (Signed)
Handout on polyps given to patient. Await pathology results. Repeat colonoscopy for screening purposes will be determined based off of pathology results. Resume previous diet and continue present medications.   YOU HAD AN ENDOSCOPIC PROCEDURE TODAY AT [facility]:   Refer to the procedure report that was given to you for any specific questions about what was found during the examination.  If the procedure report does not answer your questions, please call your gastroenterologist to clarify.  If you requested that your care partner not be given the details of your procedure findings, then the procedure report has been included in a sealed envelope for you to review at your convenience later.  YOU SHOULD EXPECT: Some feelings of bloating in the abdomen. Passage of more gas than usual.  Walking can help get rid of the air that was put into your GI tract during the procedure and reduce the bloating. If you had a lower endoscopy (such as a colonoscopy or flexible sigmoidoscopy) you may notice spotting of blood in your stool or on the toilet paper. If you underwent a bowel prep for your procedure, you may not have a normal bowel movement for a few days.  Please Note:  You might notice some irritation and congestion in your nose or some drainage.  This is from the oxygen used during your procedure.  There is no need for concern and it should clear up in a day or so.  SYMPTOMS TO REPORT IMMEDIATELY:  Following lower endoscopy (colonoscopy or flexible sigmoidoscopy):  Excessive amounts of blood in the stool  Significant tenderness or worsening of abdominal pains  Swelling of the abdomen that is new, acute  Fever of 100F or higher  For urgent or emergent issues, a gastroenterologist can be reached at any hour by calling [phone]. Do not use MyChart messaging for urgent concerns.    DIET:  We do recommend a small meal at first, but then you may proceed to your regular diet.  Drink  plenty of fluids but you should avoid alcoholic beverages for 24 hours.  ACTIVITY:  You should plan to take it easy for the rest of today and you should NOT DRIVE or use heavy machinery until tomorrow (because of the sedation medicines used during the test).    FOLLOW UP: Our staff will call the number listed on your records 48-72 hours following your procedure to check on you and address any questions or concerns that you may have regarding the information given to you following your procedure. If we do not reach you, we will leave a message.  We will attempt to reach you two times.  During this call, we will ask if you have developed any symptoms of COVID 19. If you develop any symptoms (ie: fever, flu-like symptoms, shortness of breath, cough etc.) before then, please call [phone].  If you test positive for Covid 19 in the 2 weeks post procedure, please call and report this information to [place].    If any biopsies were taken you will be contacted by phone or by letter within the next 1-3 weeks.  Please call us at [phone] if you have not heard about the biopsies in 3 weeks.    SIGNATURES/CONFIDENTIALITY: You and/or your care partner have signed paperwork which will be entered into your electronic medical record.  These signatures attest to the fact that that the information above on your After Visit Summary has been reviewed and is understood.  Full responsibility of the  confidentiality of this discharge information lies with you and/or your care-partner.  

## 2021-07-29 NOTE — Op Note (Signed)
[place] Patient Name: Marvene Strohm Procedure Date: 07/29/2021 1:48 PM MRN: [id number] Endoscopist: Thornton Park MD, MD Age: 45 Referring MD:  Date of Birth: 08-05-1976 Gender: Female Account #: [id number] Procedure:                Colonoscopy Indications:              Screening for colorectal malignant neoplasm, This                            is the patient's first colonoscopy                           No known family history of colon cancer or polyps Medicines:                Monitored Anesthesia Care Procedure:                Pre-Anesthesia Assessment:                           - Prior to the procedure, a History and Physical                            was performed, and patient medications and                            allergies were reviewed. The patient's tolerance of                            previous anesthesia was also reviewed. The risks                            and benefits of the procedure and the sedation                            options and risks were discussed with the patient.                            All questions were answered, and informed consent                            was obtained. Prior Anticoagulants: The patient has                            taken no previous anticoagulant or antiplatelet                            agents. ASA Grade Assessment: III - A patient with                            severe systemic disease. After reviewing the risks                            and benefits, the patient was deemed in  satisfactory condition to undergo the procedure.                           After obtaining informed consent, the colonoscope                            was passed under direct vision. Throughout the                            procedure, the patient's blood pressure, pulse, and                            oxygen saturations were monitored continuously. The                            Olympus CF-HQ190L  [id number]) Colonoscope was                            introduced through the anus and advanced to the 2                            cm into the ileum. A second forward view of the                            right colon was performed. The colonoscopy was                            performed without difficulty. The patient tolerated                            the procedure well. The quality of the bowel                            preparation was good. The terminal ileum, ileocecal                            valve, appendiceal orifice, and rectum were                            photographed. Scope In: 1:49:23 PM Scope Out: 2:01:39 PM Scope Withdrawal Time: 0 hours 9 minutes 59 seconds  Total Procedure Duration: 0 hours 12 minutes 16 seconds  Findings:                 The perianal and digital rectal examinations were                            normal.                           Non-bleeding internal hemorrhoids were found.                           A 2 mm polyp was found in the rectum. The polyp was  sessile. The polyp was removed with a cold snare.                            Resection and retrieval were complete. Estimated                            blood loss was minimal.                           The exam was otherwise without abnormality on                            direct and retroflexion views. Complications:            No immediate complications. Estimated blood loss:                            Minimal. Estimated Blood Loss:     Estimated blood loss was minimal. Impression:               - Non-bleeding internal hemorrhoids.                           - One 2 mm polyp in the rectum, removed with a cold                            snare. Resected and retrieved.                           - The examination was otherwise normal on direct                            and retroflexion views. Recommendation:           - Patient has a contact number available for                             emergencies. The signs and symptoms of potential                            delayed complications were discussed with the                            patient. Return to normal activities tomorrow.                            Written discharge instructions were provided to the                            patient.                           - Resume previous diet.                           - Continue present medications.                           -  Await pathology results.                           - Repeat colonoscopy date to be determined after                            pending pathology results are reviewed for                            surveillance.                           - Emerging evidence supports eating a diet of                            fruits, vegetables, grains, calcium, and yogurt                            while reducing red meat and alcohol may reduce the                            risk of colon cancer.                           - Thank you for allowing me to be involved in your                            colon cancer prevention.                           - Please follow-up with your doctor regarding your                            high blood pressure. Thornton Park MD, MD 07/29/2021 2:06:06 PM This report has been signed electronically.

## 2021-08-02 ENCOUNTER — Telehealth: Payer: Self-pay

## 2021-08-02 ENCOUNTER — Telehealth: Payer: Self-pay | Admitting: *Deleted

## 2021-08-02 NOTE — Telephone Encounter (Signed)
Attempted to reach patient for post-procedure f/u call. No answer. Left message for her to please not hesitate to call us if she has any questions/concerns regarding her care. 

## 2021-08-02 NOTE — Telephone Encounter (Signed)
  Follow up Call-  Call back number 07/29/2021  Post procedure Call Back phone  # [phone]  Permission to leave phone message Yes  Some recent data might be hidden   [facility]

## 2021-08-06 ENCOUNTER — Encounter: Payer: Self-pay | Admitting: Gastroenterology

## 2021-09-09 ENCOUNTER — Other Ambulatory Visit: Payer: Self-pay

## 2021-09-09 ENCOUNTER — Ambulatory Visit: Payer: BC Managed Care – PPO | Admitting: Nurse Practitioner

## 2021-09-09 ENCOUNTER — Encounter: Payer: Self-pay | Admitting: Nurse Practitioner

## 2021-09-09 VITALS — BP 126/88 | HR 76 | Temp 97.3°F | Ht 59.0 in | Wt 193.6 lb

## 2021-09-09 DIAGNOSIS — I10 Essential (primary) hypertension: Secondary | ICD-10-CM

## 2021-09-09 DIAGNOSIS — G629 Polyneuropathy, unspecified: Secondary | ICD-10-CM

## 2021-09-09 MED ORDER — GABAPENTIN 300 MG PO CAPS: 600.0000 mg | ORAL_CAPSULE | Freq: Two times a day (BID) | ORAL | 2 refills | Status: DC

## 2021-09-09 MED ORDER — AMLODIPINE BESYLATE 5 MG PO TABS: 5.0000 mg | ORAL_TABLET | Freq: Every day | ORAL | 3 refills | Status: DC

## 2021-09-09 NOTE — Assessment & Plan Note (Signed)
Improved BP with amlodipine and lisinopril BP Readings from Last 3 Encounters:  09/09/21 126/88  07/29/21 (!) 155/88  07/28/21 (!) 158/98    maintain current medi dose Advised to maintain DASH diet F/up in 3-28months

## 2021-09-09 NOTE — Progress Notes (Signed)
Subjective:  Patient ID: [name], female    DOB: 23-Nov-1975  Age: 46 y.o. MRN: [id number]  CC: Follow-up (4 week f/u on HTN. Pt states she has been checking BP at home and they have been ranging 120s-130s/80s-90s)  HPI  HTN (hypertension) Improved BP with amlodipine and lisinopril BP Readings from Last 3 Encounters:  09/09/21 126/88  07/29/21 (!) 155/88  07/28/21 (!) 158/98    maintain current medi dose Advised to maintain DASH diet F/up in 3-38months  Reviewed past Medical, Social and Family history today.  Outpatient Medications Prior to Visit  Medication Sig Dispense Refill   lisinopril (ZESTRIL) 20 MG tablet Take 1 tablet (20 mg total) by mouth daily. 90 tablet 3   medroxyPROGESTERone (PROVERA) 5 MG tablet Take 5 mg by mouth daily.     amLODipine (NORVASC) 5 MG tablet Take 1 tablet (5 mg total) by mouth at bedtime. (Patient taking differently: Take 5 mg by mouth at bedtime. Pt states starting this tonight 07/29/21) 90 tablet 1   aspirin 81 MG EC tablet Take 81 mg by mouth daily. (Patient not taking: Reported on 09/09/2021)     gabapentin (NEURONTIN) 300 MG capsule Take 600 mg by mouth daily. (Patient not taking: Reported on 09/09/2021)     No facility-administered medications prior to visit.    ROS See HPI  Objective:  BP 126/88 (BP Location: Left Arm, Patient Position: Sitting, Cuff Size: Large)    Pulse 76    Temp (!) 97.3 F (36.3 C) (Temporal)    Ht 4\' 11"  (1.499 m)    Wt 193 lb 9.6 oz (87.8 kg)    SpO2 99%    BMI 39.10 kg/m   Physical Exam Cardiovascular:     Rate and Rhythm: Normal rate.     Pulses: Normal pulses.  Pulmonary:     Effort: Pulmonary effort is normal.  Musculoskeletal:     Right lower leg: No edema.     Left lower leg: No edema.  Neurological:     Mental Status: She is alert and oriented to person, place, and time.  Psychiatric:        Mood and Affect: Mood normal.        Behavior: Behavior normal.        Thought Content: Thought content  normal.    Assessment & Plan:  This visit occurred during the SARS-CoV-2 public health emergency.  Safety protocols were in place, including screening questions prior to the visit, additional usage of staff PPE, and extensive cleaning of exam room while observing appropriate contact time as indicated for disinfecting solutions.   [name] was seen today for follow-up.  Diagnoses and all orders for this visit:  Primary hypertension -     amLODipine (NORVASC) 5 MG tablet; Take 1 tablet (5 mg total) by mouth daily.  Neuropathy -     gabapentin (NEURONTIN) 300 MG capsule; Take 2 capsules (600 mg total) by mouth 2 (two) times daily.    Problem List Items Addressed This Visit       Cardiovascular and Mediastinum   HTN (hypertension) - Primary    Improved BP with amlodipine and lisinopril BP Readings from Last 3 Encounters:  09/09/21 126/88  07/29/21 (!) 155/88  07/28/21 (!) 158/98    maintain current medi dose Advised to maintain DASH diet F/up in 3-69months      Relevant Medications   amLODipine (NORVASC) 5 MG tablet   Other Visit Diagnoses     Neuropathy  Relevant Medications   gabapentin (NEURONTIN) 300 MG capsule       Follow-up: Return in about 6 months (around 03/09/2022) for CPE (fasting).  Wilfred Lacy, NP

## 2021-09-09 NOTE — Patient Instructions (Signed)
Maintain current medication 

## 2021-09-15 DIAGNOSIS — R079 Chest pain, unspecified: Secondary | ICD-10-CM | POA: Diagnosis not present

## 2021-09-15 DIAGNOSIS — R072 Precordial pain: Secondary | ICD-10-CM | POA: Diagnosis not present

## 2021-09-15 DIAGNOSIS — R9431 Abnormal electrocardiogram [ECG] [EKG]: Secondary | ICD-10-CM | POA: Diagnosis not present

## 2021-10-07 DIAGNOSIS — N812 Incomplete uterovaginal prolapse: Secondary | ICD-10-CM | POA: Diagnosis not present

## 2021-10-07 DIAGNOSIS — N393 Stress incontinence (female) (male): Secondary | ICD-10-CM | POA: Diagnosis not present

## 2021-10-12 DIAGNOSIS — N812 Incomplete uterovaginal prolapse: Secondary | ICD-10-CM | POA: Insufficient documentation

## 2021-10-20 DIAGNOSIS — N393 Stress incontinence (female) (male): Secondary | ICD-10-CM | POA: Diagnosis not present

## 2021-10-20 DIAGNOSIS — N812 Incomplete uterovaginal prolapse: Secondary | ICD-10-CM | POA: Diagnosis not present

## 2021-11-01 DIAGNOSIS — N8003 Adenomyosis of the uterus: Secondary | ICD-10-CM | POA: Diagnosis not present

## 2021-11-01 DIAGNOSIS — N814 Uterovaginal prolapse, unspecified: Secondary | ICD-10-CM | POA: Diagnosis not present

## 2021-11-01 DIAGNOSIS — N393 Stress incontinence (female) (male): Secondary | ICD-10-CM | POA: Diagnosis not present

## 2021-11-01 DIAGNOSIS — D251 Intramural leiomyoma of uterus: Secondary | ICD-10-CM | POA: Diagnosis not present

## 2021-11-01 DIAGNOSIS — N838 Other noninflammatory disorders of ovary, fallopian tube and broad ligament: Secondary | ICD-10-CM | POA: Diagnosis not present

## 2021-11-01 DIAGNOSIS — N812 Incomplete uterovaginal prolapse: Secondary | ICD-10-CM | POA: Diagnosis not present

## 2021-11-01 DIAGNOSIS — N888 Other specified noninflammatory disorders of cervix uteri: Secondary | ICD-10-CM | POA: Diagnosis not present

## 2021-11-02 DIAGNOSIS — N393 Stress incontinence (female) (male): Secondary | ICD-10-CM | POA: Diagnosis not present

## 2021-11-02 DIAGNOSIS — N814 Uterovaginal prolapse, unspecified: Secondary | ICD-10-CM | POA: Diagnosis not present

## 2021-11-09 DIAGNOSIS — N814 Uterovaginal prolapse, unspecified: Secondary | ICD-10-CM | POA: Diagnosis not present

## 2021-11-09 DIAGNOSIS — R8290 Unspecified abnormal findings in urine: Secondary | ICD-10-CM | POA: Diagnosis not present

## 2021-11-27 HISTORY — PX: ABDOMINAL HYSTERECTOMY: SHX81

## 2021-12-21 DIAGNOSIS — N814 Uterovaginal prolapse, unspecified: Secondary | ICD-10-CM | POA: Diagnosis not present

## 2022-03-08 ENCOUNTER — Ambulatory Visit: Payer: BC Managed Care – PPO | Admitting: Nurse Practitioner

## 2022-03-22 ENCOUNTER — Ambulatory Visit: Payer: BC Managed Care – PPO | Admitting: Nurse Practitioner

## 2022-03-22 ENCOUNTER — Telehealth: Payer: Self-pay | Admitting: Nurse Practitioner

## 2022-03-22 NOTE — Telephone Encounter (Signed)
No show  letter sent 7.25.23

## 2022-04-04 NOTE — Telephone Encounter (Signed)
1st no show, fee waived, letter sent 

## 2022-11-12 ENCOUNTER — Other Ambulatory Visit: Payer: Self-pay | Admitting: Nurse Practitioner

## 2022-11-12 DIAGNOSIS — I10 Essential (primary) hypertension: Secondary | ICD-10-CM

## 2022-11-23 ENCOUNTER — Telehealth: Payer: Self-pay | Admitting: Nurse Practitioner

## 2022-11-23 ENCOUNTER — Other Ambulatory Visit: Payer: Self-pay | Admitting: Nurse Practitioner

## 2022-11-23 DIAGNOSIS — I10 Essential (primary) hypertension: Secondary | ICD-10-CM

## 2022-11-23 MED ORDER — AMLODIPINE BESYLATE 5 MG PO TABS: 5.0000 mg | ORAL_TABLET | Freq: Every day | ORAL | 0 refills | Status: DC

## 2022-11-23 MED ORDER — LISINOPRIL 20 MG PO TABS: 20.0000 mg | ORAL_TABLET | Freq: Every day | ORAL | 0 refills | Status: DC

## 2022-11-23 NOTE — Telephone Encounter (Signed)
Sent enough Medication for her next Office visit 01/04/23.  She will receive a complete refill then

## 2022-11-23 NOTE — Telephone Encounter (Signed)
Prescription Request  11/23/2022  LOV: Visit date not found  What is the name of the medication or equipment? amLODipine , lisinopril   Have you contacted your pharmacy to request a refill? Yes   Which pharmacy would you like this sent to?  [facility] [address] [place] [address] Phone: [phone] Fax: [phone]    Patient notified that their request is being sent to the clinical staff for review and that they should receive a response within 2 business days.   Please advise at Mobile [phone] (mobile)

## 2022-11-23 NOTE — Telephone Encounter (Signed)
Refilled enough to last until Ov on 5/8.  Patient should keep appointment

## 2023-01-04 ENCOUNTER — Ambulatory Visit (INDEPENDENT_AMBULATORY_CARE_PROVIDER_SITE_OTHER): Payer: BC Managed Care – PPO | Admitting: Nurse Practitioner

## 2023-01-04 ENCOUNTER — Encounter: Payer: Self-pay | Admitting: Nurse Practitioner

## 2023-01-04 VITALS — BP 136/84 | HR 89 | Temp 97.9°F | Ht 59.0 in | Wt 181.0 lb

## 2023-01-04 DIAGNOSIS — Z136 Encounter for screening for cardiovascular disorders: Secondary | ICD-10-CM

## 2023-01-04 DIAGNOSIS — Z13220 Encounter for screening for lipoid disorders: Secondary | ICD-10-CM

## 2023-01-04 DIAGNOSIS — K5140 Inflammatory polyps of colon without complications: Secondary | ICD-10-CM

## 2023-01-04 DIAGNOSIS — I10 Essential (primary) hypertension: Secondary | ICD-10-CM | POA: Diagnosis not present

## 2023-01-04 DIAGNOSIS — Z0001 Encounter for general adult medical examination with abnormal findings: Secondary | ICD-10-CM

## 2023-01-04 DIAGNOSIS — R739 Hyperglycemia, unspecified: Secondary | ICD-10-CM | POA: Diagnosis not present

## 2023-01-04 LAB — LIPID PANEL
Cholesterol: 135 mg/dL (ref 0–200)
HDL: 53.3 mg/dL (ref >39.00)
LDL Cholesterol: 59 mg/dL (ref 0–99)
NonHDL: 81.48
Total CHOL/HDL Ratio: 3
Triglycerides: 114 mg/dL (ref 0.0–149.0)
VLDL: 22.8 mg/dL (ref 0.0–40.0)

## 2023-01-04 LAB — COMPREHENSIVE METABOLIC PANEL
ALT: 12 U/L (ref 0–35)
AST: 15 U/L (ref 0–37)
Albumin: 4.2 g/dL (ref 3.5–5.2)
Alkaline Phosphatase: 63 U/L (ref 39–117)
BUN: 12 mg/dL (ref 6–23)
CO2: 27 mEq/L (ref 19–32)
Calcium: 9.3 mg/dL (ref 8.4–10.5)
Chloride: 101 mEq/L (ref 96–112)
Creatinine, Ser: 0.7 mg/dL (ref 0.40–1.20)
GFR: 102.85 mL/min (ref >60.00)
Glucose, Bld: 80 mg/dL (ref 70–99)
Potassium: 4 mEq/L (ref 3.5–5.1)
Sodium: 136 mEq/L (ref 135–145)
Total Bilirubin: 0.4 mg/dL (ref 0.2–1.2)
Total Protein: 8.1 g/dL (ref 6.0–8.3)

## 2023-01-04 LAB — HEMOGLOBIN A1C: Hgb A1c MFr Bld: 5.5 % (ref 4.6–6.5)

## 2023-01-04 MED ORDER — AMLODIPINE BESYLATE 5 MG PO TABS: 5.0000 mg | ORAL_TABLET | Freq: Every day | ORAL | 3 refills | Status: DC

## 2023-01-04 MED ORDER — LISINOPRIL 20 MG PO TABS: 20.0000 mg | ORAL_TABLET | Freq: Every day | ORAL | 3 refills | Status: DC

## 2023-01-04 NOTE — Progress Notes (Signed)
Stable Follow instructions as discussed during office visit.

## 2023-01-04 NOTE — Patient Instructions (Signed)
Go to lab Continue Heart healthy diet and daily exercise. Maintain current medications.  Core Strength Exercises Ask your health care provider which exercises are safe for you. Do exercises exactly as told by your health care provider and adjust them as directed. It is normal to feel mild stretching, pulling, tightness, or discomfort as you do these exercises. Stop right away if you feel sudden pain or your pain gets worse. Do not begin these exercises until told by your health care provider. Benefits of core strength exercises Core exercises help to build strength in the muscles between your ribs and your hips (abdominal muscles). These muscles help to support your body and keep your spine stable. It is important to maintain strength in your core to prevent injury and pain. Some activities, such as yoga and Pilates, can help to strengthen core muscles. You can also strengthen core muscles with exercises at home. It is important to talk to your health care provider before you start a new exercise routine. Core strength exercises can: Reduce back pain. Help to rebuild strength after a back or spine injury. Help to prevent injury during physical activity, especially injuries to the back, hips, and knees. How to do core strength exercises Repeat these exercises 10-15 times, or until you are tired. Stop if you feel any pain while doing these exercises. Contact your health care provider if your pain continues or gets worse while doing or after doing core strength exercises. For strength exercises that are done on the floor, use a padded yoga mat or an exercise mat. Bridging  Lie on your back on a firm surface with your knees bent and your feet flat on the floor. Raise your hips so that your knees, hips, and shoulders together form a straight line. Do not excessively arch your back. Keep your abdominal muscles tight. Hold this position for 3-5 seconds. Slowly lower your hips to the starting  position. Let your muscles relax completely between repetitions. Single-leg bridge  Lie on your back on a firm surface with your knees bent and your feet flat on the floor. Raise your hips so that your knees, hips, and shoulders together form a straight line. Do not excessively arch your back. Keep your abdominal muscles tight. Lift one foot off the floor while maintaining alignment in your knees, hips, and shoulders. Then, completely straighten the lifted leg. Hold this position for 3-5 seconds. Put the straight leg back down in the bent position. Slowly lower your hips to the starting position. Repeat these steps using your other leg. Side bridge  Lie on your side with your knees bent. Prop yourself up on the elbow that is near the floor. Using your abdominal muscles and the elbow you are propped up on, raise your body off the floor. Raise your hip so that your shoulder, hip, and foot together form a straight line. Hold this position for 10 seconds. Keep your head and neck raised and away from your shoulder (in their normal, neutral position). Keep your abdominal muscles tight. Slowly lower your hip to the starting position. Repeat and try to hold this position longer, working your way up to 30 seconds. Abdominal crunch  Lie on your back on a firm surface. Bend your knees and keep your feet flat on the floor. Cross your arms over your chest. Without bending your neck, tip your chin slightly toward your chest. Tighten your abdominal muscles as you lift your chest just high enough to lift your shoulder blades off the  floor. Do not hold your breath. You can do this with short lifts or long lifts. Slowly return to the starting position. Bird dog  Get on your hands and knees, with your legs shoulder-width apart and your arms under your shoulders. Keep your back straight. Tighten your abdominal muscles. Raise one of your legs off the floor and straighten it. Try to keep it parallel to the  floor. Slowly lower your leg to the starting position. Raise one of your arms off the floor and straighten it. Try to keep it parallel to the floor. Slowly lower your arm to the starting position. Repeat with the other arm and leg. If possible, try raising a leg and an arm at the same time, on opposite sides of the body. For example, raise your left hand and your right leg. Raford Pitcher on your belly. Prop up your body onto your forearms and your feet, keeping your legs straight. Your body should make a straight line between your shoulders and feet. Hold this position for 10 seconds while keeping your abdominal muscles tight. Lower your body to the starting position. Repeat and try to hold this position longer, working your way up to 30 seconds. Cross-core strengthening  Stand with your feet shoulder-width apart. Hold a ball out in front of you. Keep your arms straight. Tighten your abdominal muscles and slowly rotate at your waist from side to side. Keep your feet flat. Once you are comfortable, try repeating this exercise with a heavier ball. Top core strengthening  Stand about 18 inches (46 cm) out from a wall, with your back to the wall. Keep your feet flat and shoulder-width apart. Tighten your abdominal muscles. Bend your hips and knees. Slowly reach between your legs to touch the wall behind you. Slowly stand back up. Raise your arms over your head and reach behind you. Return to the starting position. General tips Do not do any exercises that cause pain. If you have pain while exercising, talk to your health care provider. Always stretch before and after doing these exercises. This can help prevent injury. Maintain a healthy weight. Ask your health care provider what weight is healthy for you. Contact a health care provider if: You have back pain that gets worse or does not go away. You feel pain while doing core strength exercises. Get help right away if: You have severe  pain that does not get better with medicine. Summary Core exercises help to build strength in the muscles between your ribs and your waist. Core muscles help to support your body and keep your spine stable. Some activities, such as yoga and Pilates, can help to strengthen core muscles. Core strength exercises can help back pain and can prevent injury. Stop if you feel any pain while doing core strength exercises. This information is not intended to replace advice given to you by your health care provider. Make sure you discuss any questions you have with your health care provider. Document Revised: 02/09/2021 Document Reviewed: 05/12/2020 [employer] Patient Education  2023 [employer].
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
## 2023-01-09 ENCOUNTER — Encounter: Payer: Self-pay | Admitting: Nurse Practitioner

## 2023-01-09 DIAGNOSIS — K5140 Inflammatory polyps of colon without complications: Secondary | ICD-10-CM | POA: Insufficient documentation

## 2023-01-09 NOTE — Assessment & Plan Note (Signed)
BP at goal with amlodipine and lisinopril BP Readings from Last 3 Encounters:  01/04/23 136/84  09/09/21 126/88  07/29/21 (!) 155/88    maintain med doses Advised to maintain DASH diet and daily exercise F/up in 6months

## 2023-01-09 NOTE — Assessment & Plan Note (Addendum)
No change in GI function Last colonoscopy 2022 repeat colonoscopy in 10 years per GI

## 2023-01-09 NOTE — Assessment & Plan Note (Signed)
Repeat hgba1c 

## 2023-11-25 DIAGNOSIS — R079 Chest pain, unspecified: Secondary | ICD-10-CM | POA: Diagnosis not present

## 2023-11-25 DIAGNOSIS — F17210 Nicotine dependence, cigarettes, uncomplicated: Secondary | ICD-10-CM | POA: Diagnosis not present

## 2023-11-25 DIAGNOSIS — J40 Bronchitis, not specified as acute or chronic: Secondary | ICD-10-CM | POA: Diagnosis not present

## 2023-11-25 DIAGNOSIS — Z9049 Acquired absence of other specified parts of digestive tract: Secondary | ICD-10-CM | POA: Diagnosis not present

## 2023-11-25 DIAGNOSIS — J168 Pneumonia due to other specified infectious organisms: Secondary | ICD-10-CM | POA: Diagnosis not present

## 2023-11-25 DIAGNOSIS — R0789 Other chest pain: Secondary | ICD-10-CM | POA: Diagnosis not present

## 2023-11-25 DIAGNOSIS — R918 Other nonspecific abnormal finding of lung field: Secondary | ICD-10-CM | POA: Diagnosis not present

## 2023-11-25 DIAGNOSIS — J189 Pneumonia, unspecified organism: Secondary | ICD-10-CM | POA: Diagnosis not present

## 2023-11-26 DIAGNOSIS — R918 Other nonspecific abnormal finding of lung field: Secondary | ICD-10-CM | POA: Diagnosis not present

## 2023-11-26 DIAGNOSIS — R079 Chest pain, unspecified: Secondary | ICD-10-CM | POA: Diagnosis not present

## 2023-11-26 DIAGNOSIS — J40 Bronchitis, not specified as acute or chronic: Secondary | ICD-10-CM | POA: Diagnosis not present

## 2023-11-26 DIAGNOSIS — Z9049 Acquired absence of other specified parts of digestive tract: Secondary | ICD-10-CM | POA: Diagnosis not present

## 2023-11-26 LAB — BASIC METABOLIC PANEL WITH GFR
BUN: 11 (ref 4–21)
CO2: 22 (ref 13–22)
Chloride: 108 (ref 99–108)
Creatinine: 0.7 (ref 0.5–1.1)
Glucose: 90
Potassium: 3.5 meq/L (ref 3.5–5.1)
Sodium: 140 (ref 137–147)

## 2023-11-26 LAB — COMPREHENSIVE METABOLIC PANEL WITH GFR
Albumin: 4.2 (ref 3.5–5.0)
Calcium: 9.2 (ref 8.7–10.7)
eGFR: >90

## 2023-11-27 DIAGNOSIS — I498 Other specified cardiac arrhythmias: Secondary | ICD-10-CM | POA: Diagnosis not present

## 2023-11-27 DIAGNOSIS — R079 Chest pain, unspecified: Secondary | ICD-10-CM | POA: Diagnosis not present

## 2023-12-01 ENCOUNTER — Encounter: Payer: Self-pay | Admitting: Nurse Practitioner

## 2024-03-13 DIAGNOSIS — R29810 Facial weakness: Secondary | ICD-10-CM | POA: Diagnosis not present

## 2024-03-13 DIAGNOSIS — R42 Dizziness and giddiness: Secondary | ICD-10-CM | POA: Diagnosis not present

## 2024-03-13 DIAGNOSIS — I10 Essential (primary) hypertension: Secondary | ICD-10-CM | POA: Diagnosis not present

## 2024-03-13 DIAGNOSIS — R112 Nausea with vomiting, unspecified: Secondary | ICD-10-CM | POA: Diagnosis not present

## 2024-03-13 DIAGNOSIS — Z8739 Personal history of other diseases of the musculoskeletal system and connective tissue: Secondary | ICD-10-CM | POA: Diagnosis not present

## 2024-03-13 DIAGNOSIS — M25472 Effusion, left ankle: Secondary | ICD-10-CM | POA: Diagnosis not present

## 2024-03-13 DIAGNOSIS — Z9889 Other specified postprocedural states: Secondary | ICD-10-CM | POA: Diagnosis not present

## 2024-03-13 DIAGNOSIS — Z87898 Personal history of other specified conditions: Secondary | ICD-10-CM | POA: Diagnosis not present

## 2024-03-13 DIAGNOSIS — Z8639 Personal history of other endocrine, nutritional and metabolic disease: Secondary | ICD-10-CM | POA: Diagnosis not present

## 2024-03-13 DIAGNOSIS — R55 Syncope and collapse: Secondary | ICD-10-CM | POA: Diagnosis not present

## 2024-03-13 DIAGNOSIS — Z8673 Personal history of transient ischemic attack (TIA), and cerebral infarction without residual deficits: Secondary | ICD-10-CM | POA: Diagnosis not present

## 2024-03-13 DIAGNOSIS — Z8669 Personal history of other diseases of the nervous system and sense organs: Secondary | ICD-10-CM | POA: Diagnosis not present

## 2024-03-15 ENCOUNTER — Encounter: Payer: Self-pay | Admitting: Advanced Practice Midwife

## 2024-08-14 ENCOUNTER — Other Ambulatory Visit: Payer: Self-pay | Admitting: Nurse Practitioner

## 2024-08-14 DIAGNOSIS — I10 Essential (primary) hypertension: Secondary | ICD-10-CM

## 2024-08-14 MED ORDER — LISINOPRIL 20 MG PO TABS: 20.0000 mg | ORAL_TABLET | Freq: Every day | ORAL | 3 refills | Status: AC

## 2024-08-14 MED ORDER — AMLODIPINE BESYLATE 5 MG PO TABS: 5.0000 mg | ORAL_TABLET | Freq: Every day | ORAL | 3 refills | Status: AC

## 2024-08-14 NOTE — Telephone Encounter (Signed)
 Please review and advise:  Requesting: Lisinopril  20 mg  Last Visit: 01/04/2023 Next Visit: 10/29/2024 Last Refill: 01/04/2023  Requesting: Amlodipine  5 mg  Last Visit: 01/04/2023 Next Visit: 10/29/2024 Last Refill: 01/04/2023

## 2024-08-14 NOTE — Telephone Encounter (Signed)
 Prescription Request  08/14/2024  LOV: 12/01/2023  What is the name of the medication or equipment? amLODipine  (NORVASC ) 5 MG tablet [[id number]] & lisinopril  (ZESTRIL ) 20 MG tablet [[id number]]  ENDED   Have you contacted your pharmacy to request a refill? Yes, she needs ov, she had cpe scheduled for 09/13/24, it has been pushed back to March  Which pharmacy would you like this sent to?  [id number] - [place], [place] - [address] [place] [address] Phone: [phone] Fax: [phone]    Patient notified that their request is being sent to the clinical staff for review and that they should receive a response within 2 business days.   Please advise at [facility] [phone]

## 2024-09-13 ENCOUNTER — Encounter: Admitting: Nurse Practitioner

## 2024-10-29 ENCOUNTER — Encounter: Admitting: Nurse Practitioner
